# Patient Record
Sex: Male | Born: 1960 | Race: White | Hispanic: No | Marital: Married | Smoking: Current some day smoker
Health system: Southern US, Community
[De-identification: ages and names within clinical notes are randomized; demographics above are authoritative.]

## PROBLEM LIST (undated history)

## (undated) DIAGNOSIS — F17200 Nicotine dependence, unspecified, uncomplicated: Secondary | ICD-10-CM

## (undated) DIAGNOSIS — J302 Other seasonal allergic rhinitis: Secondary | ICD-10-CM

## (undated) DIAGNOSIS — F1210 Cannabis abuse, uncomplicated: Secondary | ICD-10-CM

## (undated) DIAGNOSIS — R55 Syncope and collapse: Secondary | ICD-10-CM

## (undated) DIAGNOSIS — M1990 Unspecified osteoarthritis, unspecified site: Secondary | ICD-10-CM

## (undated) DIAGNOSIS — I119 Hypertensive heart disease without heart failure: Secondary | ICD-10-CM

## (undated) DIAGNOSIS — I10 Essential (primary) hypertension: Secondary | ICD-10-CM

## (undated) DIAGNOSIS — E669 Obesity, unspecified: Secondary | ICD-10-CM

## (undated) DIAGNOSIS — E119 Type 2 diabetes mellitus without complications: Secondary | ICD-10-CM

## (undated) DIAGNOSIS — T7840XA Allergy, unspecified, initial encounter: Secondary | ICD-10-CM

## (undated) HISTORY — PX: FRACTURE SURGERY: SHX138

## (undated) HISTORY — DX: Syncope and collapse: R55

## (undated) HISTORY — DX: Obesity, unspecified: E66.9

## (undated) HISTORY — DX: Allergy, unspecified, initial encounter: T78.40XA

## (undated) HISTORY — DX: Nicotine dependence, unspecified, uncomplicated: F17.200

## (undated) HISTORY — DX: Cannabis abuse, uncomplicated: F12.10

## (undated) HISTORY — PX: REPLACEMENT TOTAL KNEE: SUR1224

## (undated) HISTORY — DX: Hypertensive heart disease without heart failure: I11.9

---

## 2000-02-10 ENCOUNTER — Ambulatory Visit (HOSPITAL_COMMUNITY): Admission: RE | Admit: 2000-02-10 | Discharge: 2000-02-10 | Payer: Self-pay | Admitting: Orthopedic Surgery

## 2000-02-10 ENCOUNTER — Encounter: Payer: Self-pay | Admitting: Orthopedic Surgery

## 2007-03-23 ENCOUNTER — Emergency Department (HOSPITAL_COMMUNITY): Admission: EM | Admit: 2007-03-23 | Discharge: 2007-03-23 | Payer: Self-pay | Admitting: Emergency Medicine

## 2007-03-23 ENCOUNTER — Ambulatory Visit: Payer: Self-pay | Admitting: Internal Medicine

## 2007-03-29 ENCOUNTER — Ambulatory Visit: Payer: Self-pay | Admitting: Family Medicine

## 2007-03-30 LAB — CONVERTED CEMR LAB
ALT: 21 units/L (ref 0–40)
AST: 20 units/L (ref 0–37)
Albumin: 3.8 g/dL (ref 3.5–5.2)
Alkaline Phosphatase: 38 units/L — ABNORMAL LOW (ref 39–117)
BUN: 16 mg/dL (ref 6–23)
Basophils Absolute: 0.2 10*3/uL — ABNORMAL HIGH (ref 0.0–0.1)
Basophils Relative: 2.1 % — ABNORMAL HIGH (ref 0.0–1.0)
Bilirubin, Direct: 0.1 mg/dL (ref 0.0–0.3)
CO2: 31 meq/L (ref 19–32)
Calcium: 9.4 mg/dL (ref 8.4–10.5)
Chloride: 106 meq/L (ref 96–112)
Creatinine, Ser: 0.9 mg/dL (ref 0.4–1.5)
Eosinophils Absolute: 0.2 10*3/uL (ref 0.0–0.6)
Eosinophils Relative: 1.6 % (ref 0.0–5.0)
GFR calc Af Amer: 117 mL/min
GFR calc non Af Amer: 97 mL/min
Glucose, Bld: 78 mg/dL (ref 70–99)
HCT: 40.9 % (ref 39.0–52.0)
Hemoglobin: 14.2 g/dL (ref 13.0–17.0)
Lymphocytes Relative: 31.7 % (ref 12.0–46.0)
MCHC: 34.8 g/dL (ref 30.0–36.0)
MCV: 92.8 fL (ref 78.0–100.0)
Monocytes Absolute: 0.4 10*3/uL (ref 0.2–0.7)
Monocytes Relative: 4.5 % (ref 3.0–11.0)
Neutro Abs: 5.6 10*3/uL (ref 1.4–7.7)
Neutrophils Relative %: 60.1 % (ref 43.0–77.0)
Platelets: 302 10*3/uL (ref 150–400)
Potassium: 4.1 meq/L (ref 3.5–5.1)
RBC: 4.4 M/uL (ref 4.22–5.81)
RDW: 12 % (ref 11.5–14.6)
Sodium: 142 meq/L (ref 135–145)
TSH: 0.66 microintl units/mL (ref 0.35–5.50)
Total Bilirubin: 0.7 mg/dL (ref 0.3–1.2)
Total Protein: 6.9 g/dL (ref 6.0–8.3)
WBC: 9.4 10*3/uL (ref 4.5–10.5)

## 2007-04-12 ENCOUNTER — Ambulatory Visit: Payer: Self-pay

## 2007-04-12 ENCOUNTER — Encounter: Payer: Self-pay | Admitting: Internal Medicine

## 2007-05-10 ENCOUNTER — Ambulatory Visit: Payer: Self-pay | Admitting: Family Medicine

## 2007-08-14 DIAGNOSIS — R51 Headache: Secondary | ICD-10-CM

## 2007-08-14 DIAGNOSIS — R519 Headache, unspecified: Secondary | ICD-10-CM | POA: Insufficient documentation

## 2007-09-15 ENCOUNTER — Telehealth: Payer: Self-pay | Admitting: Family Medicine

## 2007-09-28 ENCOUNTER — Ambulatory Visit: Payer: Self-pay | Admitting: Family Medicine

## 2007-09-28 DIAGNOSIS — I119 Hypertensive heart disease without heart failure: Secondary | ICD-10-CM | POA: Insufficient documentation

## 2007-09-28 DIAGNOSIS — F17200 Nicotine dependence, unspecified, uncomplicated: Secondary | ICD-10-CM

## 2007-09-28 DIAGNOSIS — E669 Obesity, unspecified: Secondary | ICD-10-CM

## 2007-09-28 HISTORY — DX: Obesity, unspecified: E66.9

## 2007-09-28 HISTORY — DX: Nicotine dependence, unspecified, uncomplicated: F17.200

## 2007-09-28 HISTORY — DX: Hypertensive heart disease without heart failure: I11.9

## 2007-11-03 ENCOUNTER — Ambulatory Visit: Payer: Self-pay | Admitting: Family Medicine

## 2007-12-16 ENCOUNTER — Emergency Department (HOSPITAL_COMMUNITY): Admission: EM | Admit: 2007-12-16 | Discharge: 2007-12-16 | Payer: Self-pay | Admitting: Emergency Medicine

## 2007-12-20 ENCOUNTER — Telehealth (INDEPENDENT_AMBULATORY_CARE_PROVIDER_SITE_OTHER): Payer: Self-pay | Admitting: *Deleted

## 2007-12-27 ENCOUNTER — Telehealth: Payer: Self-pay | Admitting: Family Medicine

## 2007-12-29 ENCOUNTER — Encounter: Payer: Self-pay | Admitting: Family Medicine

## 2007-12-29 ENCOUNTER — Ambulatory Visit (HOSPITAL_COMMUNITY): Admission: RE | Admit: 2007-12-29 | Discharge: 2007-12-29 | Payer: Self-pay | Admitting: Cardiology

## 2008-12-20 DIAGNOSIS — R55 Syncope and collapse: Secondary | ICD-10-CM

## 2008-12-20 HISTORY — DX: Syncope and collapse: R55

## 2009-12-26 ENCOUNTER — Telehealth: Payer: Self-pay | Admitting: Family Medicine

## 2009-12-29 ENCOUNTER — Emergency Department (HOSPITAL_COMMUNITY): Admission: EM | Admit: 2009-12-29 | Discharge: 2009-12-29 | Payer: Self-pay | Admitting: Emergency Medicine

## 2010-12-20 HISTORY — PX: THUMB ARTHROSCOPY: SHX2509

## 2011-01-10 ENCOUNTER — Encounter: Payer: Self-pay | Admitting: Family Medicine
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
## 2011-02-15 ENCOUNTER — Emergency Department (HOSPITAL_BASED_OUTPATIENT_CLINIC_OR_DEPARTMENT_OTHER)
Admission: EM | Admit: 2011-02-15 | Discharge: 2011-02-15 | Disposition: A | Discharge status: Home / Self Care | Payer: BC Managed Care – PPO | Attending: Emergency Medicine | Admitting: Emergency Medicine

## 2011-02-15 ENCOUNTER — Emergency Department (INDEPENDENT_AMBULATORY_CARE_PROVIDER_SITE_OTHER): Payer: BC Managed Care – PPO

## 2011-02-15 DIAGNOSIS — M545 Low back pain, unspecified: Secondary | ICD-10-CM | POA: Insufficient documentation

## 2011-02-15 DIAGNOSIS — R209 Unspecified disturbances of skin sensation: Secondary | ICD-10-CM

## 2011-02-15 DIAGNOSIS — M25569 Pain in unspecified knee: Secondary | ICD-10-CM

## 2011-02-15 DIAGNOSIS — M549 Dorsalgia, unspecified: Secondary | ICD-10-CM

## 2011-02-15 DIAGNOSIS — Y92410 Unspecified street and highway as the place of occurrence of the external cause: Secondary | ICD-10-CM | POA: Insufficient documentation

## 2011-02-15 DIAGNOSIS — S93609A Unspecified sprain of unspecified foot, initial encounter: Secondary | ICD-10-CM | POA: Insufficient documentation

## 2011-02-15 DIAGNOSIS — M79609 Pain in unspecified limb: Secondary | ICD-10-CM

## 2011-03-02 ENCOUNTER — Telehealth: Payer: Self-pay | Admitting: Family Medicine
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
## 2011-03-03 ENCOUNTER — Encounter: Payer: Self-pay | Admitting: Family Medicine

## 2011-03-03 NOTE — Telephone Encounter (Signed)
I cannot see him this week. Maybe someone else can.

## 2011-03-03 NOTE — Telephone Encounter (Signed)
Pt called back to check on status of getting work in ov with Dr Clent Ridges. Pt informed that Dr Clent Ridges unavailable this week. Pt has been schedule a ov with Dr Caryl Never for Thurs 03/04/11.

## 2011-03-04 ENCOUNTER — Ambulatory Visit (INDEPENDENT_AMBULATORY_CARE_PROVIDER_SITE_OTHER): Payer: No Typology Code available for payment source | Admitting: Family Medicine

## 2011-03-04 ENCOUNTER — Encounter: Payer: Self-pay | Admitting: Family Medicine

## 2011-03-04 VITALS — BP 130/82 | Temp 98.7°F | Ht 68.0 in | Wt 240.0 lb

## 2011-03-04 DIAGNOSIS — M545 Low back pain: Secondary | ICD-10-CM

## 2011-03-04 MED ORDER — MELOXICAM 15 MG PO TABS: 15.0000 mg | ORAL_TABLET | Freq: Every day | ORAL | Status: DC

## 2011-03-04 NOTE — Patient Instructions (Signed)
Low Back Sprain with Rehab    A sprain is an injury in which a ligament is torn. The ligaments of the lower back are vulnerable to sprains. However, they are strong and require great force to be injured. These ligaments are important for stabilizing the spinal column. Sprains are classified into three categories. Grade 1 sprains cause pain, but the tendon is not lengthened. Grade 2 sprains include a lengthened ligament, due to the ligament being stretched or partially ruptured. With grade 2 sprains there is still function, although the function may be decreased. Grade 3 sprains involve a complete tear of the tendon or muscle, and function is usually impaired.   SYMPTOMS  Severe pain in the lower back.  Sometimes, a feeling of a "pop," "snap," or tear, at the time of injury.  Tenderness and sometimes swelling at the injury site.  Uncommonly, bruising (contusion) within 48 hours of injury.  Muscle spasms in the back.   CAUSES  Low back sprains occur when a force is placed on the ligaments that is greater than they can handle. Common causes of injury include:  Performing a stressful act while off-balance.  Repetitive stressful activities that involve movement of the lower back.  Direct hit (trauma) to the lower back.   RISK INCREASES WITH   Contact sports (football, wrestling).  Collisions (major skiing accidents).  Sports that require throwing or lifting (baseball, weightlifting).  Sports involving twisting of the spine (gymnastics, diving, tennis, golf).  Poor strength and flexibility.  Inadequate protection.  Previous back injury or surgery (especially fusion).   PREVENTIVE MEASURES   Wear properly fitted and padded protective equipment.  Warm up and stretch properly before activity.  Allow for adequate recovery between workouts.  Maintain physical fitness: l Strength, flexibility, and endurance. l Cardiovascular fitness.  Maintain a healthy body weight.     PROGNOSIS If treated properly, low back sprains usually heal with non-surgical treatment. The length of time for healing depends on the severity of the injury.    POSSIBLE COMPLICATIONS   Recurring symptoms, resulting in a chronic problem.  Chronic inflammation and pain in the low back.  Delayed healing or resolution of symptoms, especially if activity is resumed too soon.  Prolonged impairment.  Unstable or arthritic joints of the low back.   GENERAL TREATMENT CONSIDERATIONS  Treatment first involves the use of ice and medicine, to reduce pain and inflammation. The use of strengthening and stretching exercises may help reduce pain with activity. These exercises may be performed at home or with a therapist. Severe injuries may require referral to a therapist for further evaluation and treatment, such as ultrasound. Your caregiver may advise that you wear a back brace or corset, to help reduce pain and discomfort. Often, prolonged bed rest results in greater harm then benefit. Corticosteroid injections may be recommended. However, these should be reserved for the most serious cases. It is important to avoid using your back when lifting objects. At night, sleep on your back on a firm mattress, with a pillow placed under your knees. If non-surgical treatment is unsuccessful, surgery may be needed.    MEDICATION:   If pain medicine is needed, nonsteroidal anti-inflammatory medicines (aspirin and ibuprofen), or other minor pain relievers (acetaminophen), are often advised.   Do not take pain medicine for 7 days before surgery.   Prescription pain relievers may be given, if your caregiver thinks they are needed. Use only as directed and only as much as you need.  Ointments applied to   the skin may be helpful.  Corticosteroid injections may be given by your caregiver. These injections should be reserved for the most serious cases, because they may only be given a certain number of times.    HEAT AND COLD:   Cold treatment (icing) should be applied for 10 to 15 minutes every 2 to 3 hours for inflammation and pain, and immediately after activity that aggravates your symptoms. Use ice packs or an ice massage.  Heat treatment may be used before performing stretching and strengthening activities prescribed by your caregiver, physical therapist, or athletic trainer. Use a heat pack or a warm water soak.     SEEK MEDICAL CARE IF:   Symptoms get worse or do not improve in 2 to 4 weeks, despite treatment.  You develop numbness or weakness in either leg.  You lose bowel or bladder function.  Any of the following occur after surgery: fever, increased pain, swelling, redness, drainage of fluids, or bleeding in the affected area.  New, unexplained symptoms develop. (Drugs used in treatment may produce side effects.)     EXERCISES   RANGE OF MOTION AND STRETCHING EXERCISES - Low Back Sprain Most people with lower back pain will find that their symptoms get worse with excessive bending forward (flexion) or arching at the lower back (extension). The exercises that will help resolve your symptoms will focus on the opposite motion.    Your physician, physical therapist or athletic trainer will help you determine which exercises will be most helpful to resolve your lower back pain. Do not complete any exercises without first consulting with your caregiver. Discontinue any exercises which make your symptoms worse, until you speak to your caregiver.    If you have pain, numbness or tingling which travels down into your buttocks, leg or foot, the goal of the therapy is for these symptoms to move closer to your back and eventually resolve. Sometimes, these leg symptoms will get better, but your lower back pain may worsen. This is often an indication of progress in your rehabilitation. Be very alert to any changes in your symptoms and the activities in which you participated in the 24 hours prior  to the change. Sharing this information with your caregiver will allow him or her to most efficiently treat your condition.   These exercises may help you when beginning to rehabilitate your injury. Your symptoms may resolve with or without further involvement from your physician, physical therapist or athletic trainer. While completing these exercises, remember:   Restoring tissue flexibility helps normal motion to return to the joints. This allows healthier, less painful movement and activity.  An effective stretch should be held for at least 30 seconds.  A stretch should never be painful. You should only feel a gentle lengthening or release in the stretched tissue.      FLEXION RANGE OF MOTION AND STRETCHING EXERCISES:  STRETCH - Flexion, Single Knee to Chest   Lie on a firm bed or floor with both legs extended in front of you.  Keeping one leg in contact with the floor, bring your opposite knee to your chest. Hold your leg in place by either grabbing behind your thigh or at your knee.  Pull until you feel a gentle stretch in your low back. Hold __________ seconds.  Slowly release your grasp and repeat the exercise with the opposite side. Repeat __________ times. Complete this exercise __________ times per day.     STRETCH - Flexion, Double Knee to Chest     Lie on a firm bed or floor with both legs extended in front of you.  Keeping one leg in contact with the floor, bring your opposite knee to your chest.    Tense your stomach muscles to support your back and then lift your other knee to your chest. Hold your legs in place by either grabbing behind your thighs or at your knees.  Pull both knees toward your chest until you feel a gentle stretch in your low back. Hold __________ seconds.  Tense your stomach muscles and slowly return one leg at a time to the floor. Repeat __________ times. Complete this exercise __________ times per day.     STRETCH - Low Trunk Rotation   Lie  on a firm bed or floor. Keeping your legs in front of you, bend your knees so they are both pointed toward the ceiling and your feet are flat on the floor.  Extend your arms out to the side. This will stabilize your upper body by keeping your shoulders in contact with the floor.  Gently and slowly drop both knees together to one side until you feel a gentle stretch in your low back. Hold for __________ seconds.   Tense your stomach muscles to support your lower back as you bring your knees back to the starting position. Repeat the exercise to the other side. Repeat __________ times. Complete this exercise __________ times per day      EXTENSION RANGE OF MOTION AND FLEXIBILITY EXERCISES:  STRETCH - Extension, Prone on Elbows   Lie on your stomach on the floor, a bed will be too soft. Place your palms about shoulder width apart and at the height of your head.  Place your elbows under your shoulders. If this is too painful, stack pillows under your chest.  Allow your body to relax so that your hips drop lower and make contact more completely with the floor.  Hold this position for __________ seconds.  Slowly return to lying flat on the floor. Repeat __________ times. Complete this exercise __________ times per day.     RANGE OF MOTION - Extension, Prone Press Ups   Lie on your stomach on the floor, a bed will be too soft. Place your palms about shoulder width apart and at the height of your head.  Keeping your back as relaxed as possible, slowly straighten your elbows while keeping your hips on the floor. You may adjust the placement of your hands to maximize your comfort. As you gain motion, your hands will come more underneath your shoulders.  Hold this position __________ seconds.  Slowly return to lying flat on the floor. Repeat __________ times. Complete this exercise __________ times per day.     RANGE OF MOTION- Quadruped, Neutral Spine   Assume a hands and knees position  on a firm surface. Keep your hands under your shoulders and your knees under your hips. You may place padding under your knees for comfort.    Drop your head and point your tailbone toward the ground below you. This will round out your lower back like an angry cat. Hold this position for __________ seconds.   Slowly lift your head and release your tail bone so that your back sags into a large arch, like an old horse.  Hold this position for __________ seconds.   Repeat this until you feel limber in your low back.  Now, find your "sweet spot." This will be the most comfortable position somewhere between the two previous   positions. This is your neutral spine. Once you have found this position, tense your stomach muscles to support your low back.  Hold this position for __________ seconds. Repeat __________ times. Complete this exercise __________ times per day.      STRENGTHENING EXERCISES - Low Back Sprain These exercises may help you when beginning to rehabilitate your injury. These exercises should be done near your "sweet spot." This is the neutral, low-back arch, somewhere between fully rounded and fully arched, that is your least painful position. When performed in this safe range of motion, these exercises can be used for people who have either a flexion or extension based injury. These exercises may resolve your symptoms with or without further involvement from your physician, physical therapist or athletic trainer. While completing these exercises, remember:   Muscles can gain both the endurance and the strength needed for everyday activities through controlled exercises.  Complete these exercises as instructed by your physician, physical therapist or athletic trainer. Increase the resistance and repetitions only as guided.  You may experience muscle soreness or fatigue, but the pain or discomfort you are trying to eliminate should never worsen during these exercises. If this pain does  worsen, stop and make certain you are following the directions exactly. If the pain is still present after adjustments, discontinue the exercise until you can discuss the trouble with your caregiver.     STRENGTHENING - Deep Abdominals, Pelvic Tilt   Lie on a firm bed or floor. Keeping your legs in front of you, bend your knees so they are both pointed toward the ceiling and your feet are flat on the floor.  Tense your lower abdominal muscles to press your low back into the floor.  This motion will rotate your pelvis so that your tail bone is scooping upwards rather than pointing at your feet or into the floor. With a gentle tension and even breathing, hold this position for __________ seconds. Repeat __________ times. Complete this exercise __________ times per day.      STRENGTHENING - Abdominals, Crunches   Lie on a firm bed or floor. Keeping your legs in front of you, bend your knees so they are both pointed toward the ceiling and your feet are flat on the floor. Cross your arms over your chest.    Slightly tip your chin down without bending your neck.  Tense your abdominals and slowly lift your trunk high enough to just clear your shoulder blades. Lifting higher can put excessive stress on the lower back and does not further strengthen your abdominal muscles.  Control your return to the starting position. Repeat __________ times. Complete this exercise __________ times per day.     STRENGTHENING - Quadruped, Opposite UE/LE Lift   Assume a hands and knees position on a firm surface. Keep your hands under your shoulders and your knees under your hips. You may place padding under your knees for comfort.    Find your neutral spine and gently tense your abdominal muscles so that you can maintain this position. Your shoulders and hips should form a rectangle that is parallel with the floor and is not twisted.   Keeping your trunk steady, lift your right hand no higher than your shoulder  and then your left leg no higher than your hip. Make sure you are not holding your breath. Hold this position for __________ seconds.  Continuing to keep your abdominal muscles tense and your back steady, slowly return to your starting position. Repeat with the   opposite arm and leg. Repeat __________ times. Complete this exercise __________ times per day.      STRENGTHENING - Abdominals and Quadriceps, Straight Leg Raise   Lie on a firm bed or floor with both legs extended in front of you.  Keeping one leg in contact with the floor, bend the other knee so that your foot can rest flat on the floor.  Find your neutral spine, and tense your abdominal muscles to maintain your spinal position throughout the exercise.  Slowly lift your straight leg off the floor about 6 inches for a count of 15, making sure to not hold your breath.  Still keeping your neutral spine, slowly lower your leg all the way to the floor.   Repeat this exercise with each leg __________ times. Complete this exercise __________ times per day.     POSTURE AND BODY MECHANICS CONSIDERATIONS - Low Back Sprain Keeping correct posture when sitting, standing or completing your activities will reduce the stress put on different body tissues, allowing injured tissues a chance to heal and limiting painful experiences. The following are general guidelines for improved posture. Your physician or physical therapist will provide you with any instructions specific to your needs. While reading these guidelines, remember:  The exercises prescribed by your provider will help you have the flexibility and strength to maintain correct postures.  The correct posture provides the best environment for your joints to work. All of your joints have less wear and tear when properly supported by a spine with good posture. This means you will experience a healthier, less painful body.  Correct posture must be practiced with all of your activities,  especially prolonged sitting and standing. Correct posture is as important when doing repetitive low-stress activities (typing) as it is when doing a single heavy-load activity (lifting).     RESTING POSITIONS Consider which positions are most painful for you when choosing a resting position. If you have pain with flexion-based activities (sitting, bending, stooping, squatting), choose a position that allows you to rest in a less flexed posture. You would want to avoid curling into a fetal position on your side. If your pain worsens with extension-based activities (prolonged standing, working overhead), avoid resting in an extended position such as sleeping on your stomach. Most people will find more comfort when they rest with their spine in a more neutral position, neither too rounded nor too arched. Lying on a non-sagging bed on your side with a pillow between your knees, or on your back with a pillow under your knees will often provide some relief.  Keep in mind, being in any one position for a prolonged period of time, no matter how correct your posture, can still lead to stiffness.    PROPER SITTING POSTURE In order to minimize stress and discomfort on your spine, you must sit with correct posture. Sitting with good posture should be effortless for a healthy body. Returning to good posture is a gradual process. Many people can work toward this most comfortably by using various supports until they have the flexibility and strength to maintain this posture on their own.   When sitting with proper posture, your ears will fall over your shoulders and your shoulders will fall over your hips. You should use the back of the chair to support your upper back. Your lower back will be in a neutral position, just slightly arched. You may place a small pillow or folded towel at the base of your lower back for    support.    When working at a desk, create an environment that supports good, upright posture.  Without extra support, muscles tire, which leads to excessive strain on joints and other tissues. Keep these recommendations in mind:   CHAIR:    A chair should be able to slide under your desk when your back makes contact with the back of the chair. This allows you to work closely.  The chair's height should allow your eyes to be level with the upper part of your monitor and your hands to be slightly lower than your elbows.      BODY POSITION  Your feet should make contact with the floor. If this is not possible, use a foot rest.  Keep your ears over your shoulders. This will reduce stress on your neck and low back.     INCORRECT SITTING POSTURES  If you are feeling tired and unable to assume a healthy sitting posture, do not slouch or slump. This puts excessive strain on your back tissues, causing more damage and pain. Healthier options include:  Using more support, like a lumbar pillow.  Switching tasks to something that requires you to be upright or walking.  Talking a brief walk.  Lying down to rest in a neutral-spine position.      PROLONGED STANDING WHILE SLIGHTLY LEANING FORWARD  When completing a task that requires you to lean forward while standing in one place for a long time, place either foot up on a stationary 2-4 inch high object to help maintain the best posture. When both feet are on the ground, the lower back tends to lose its slight inward curve. If this curve flattens (or becomes too large), then the back and your other joints will experience too much stress, tire more quickly, and can cause pain.       CORRECT STANDING POSTURES Proper standing posture should be assumed with all daily activities, even if they only take a few moments, like when brushing your teeth. As in sitting, your ears should fall over your shoulders and your shoulders should fall over your hips. You should keep a slight tension in your abdominal muscles to brace your spine. Your tailbone  should point down to the ground, not behind your body, resulting in an over-extended swayback posture.      INCORRECT STANDING POSTURES  Common incorrect standing postures include a forward head, locked knees and/or an excessive swayback.     WALKING Walk with an upright posture. Your ears, shoulders and hips should all line-up.     PROLONGED ACTIVITY IN A FLEXED POSITION When completing a task that requires you to bend forward at your waist or lean over a low surface, try to find a way to stabilize 3 out of 4 of your limbs. You can place a hand or elbow on your thigh or rest a knee on the surface you are reaching across. This will provide you more stability, so that your muscles do not tire as quickly. By keeping your knees relaxed, or slightly bent, you will also reduce stress across your lower back.     CORRECT LIFTING TECHNIQUES DO :   Assume a wide stance. This will provide you more stability and the opportunity to get as close as possible to the object which you are lifting.  Tense your abdominals to brace your spine. Bend at the knees and hips. Keeping your back locked in a neutral-spine position, lift using your leg muscles. Lift with your legs, keeping your   back straight.  Test the weight of unknown objects before attempting to lift them.  Try to keep your elbows locked down at your sides in order get the best strength from your shoulders when carrying an object.  Always ask for help when lifting heavy or awkward objects.    INCORRECT LIFTING TECHNIQUES DO NOT:   Lock your knees when lifting, even if it is a small object.  Bend and twist. Pivot at your feet or move your feet when needing to change directions.  Assume that you can safely pick up even a paperclip without proper posture.   Document Released: 12/06/2005  Document Re-Released: 10/03/2009 [employer] Patient Information 2011 [employer] 
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
## 2011-05-01 ENCOUNTER — Emergency Department (INDEPENDENT_AMBULATORY_CARE_PROVIDER_SITE_OTHER): Payer: No Typology Code available for payment source

## 2011-05-01 ENCOUNTER — Emergency Department (HOSPITAL_BASED_OUTPATIENT_CLINIC_OR_DEPARTMENT_OTHER)
Admission: EM | Admit: 2011-05-01 | Discharge: 2011-05-01 | Disposition: A | Discharge status: Home / Self Care | Payer: No Typology Code available for payment source | Attending: Emergency Medicine | Admitting: Emergency Medicine

## 2011-05-01 DIAGNOSIS — M545 Low back pain: Secondary | ICD-10-CM

## 2011-05-01 DIAGNOSIS — M79609 Pain in unspecified limb: Secondary | ICD-10-CM | POA: Insufficient documentation

## 2011-05-01 DIAGNOSIS — M25569 Pain in unspecified knee: Secondary | ICD-10-CM | POA: Insufficient documentation

## 2011-05-01 DIAGNOSIS — M549 Dorsalgia, unspecified: Secondary | ICD-10-CM | POA: Insufficient documentation

## 2011-05-01 DIAGNOSIS — Y92410 Unspecified street and highway as the place of occurrence of the external cause: Secondary | ICD-10-CM | POA: Insufficient documentation

## 2011-05-01 DIAGNOSIS — M51379 Other intervertebral disc degeneration, lumbosacral region without mention of lumbar back pain or lower extremity pain: Secondary | ICD-10-CM | POA: Insufficient documentation

## 2011-05-01 DIAGNOSIS — M5137 Other intervertebral disc degeneration, lumbosacral region: Secondary | ICD-10-CM | POA: Insufficient documentation

## 2011-05-01 DIAGNOSIS — M542 Cervicalgia: Secondary | ICD-10-CM

## 2011-05-03 ENCOUNTER — Ambulatory Visit (INDEPENDENT_AMBULATORY_CARE_PROVIDER_SITE_OTHER): Payer: BC Managed Care – PPO | Admitting: Family Medicine

## 2011-05-03 ENCOUNTER — Encounter: Payer: Self-pay | Admitting: Family Medicine

## 2011-05-03 VITALS — BP 170/84 | HR 68 | Temp 98.9°F | Ht 68.0 in | Wt 254.0 lb

## 2011-05-03 DIAGNOSIS — M545 Low back pain, unspecified: Secondary | ICD-10-CM

## 2011-05-03 MED ORDER — CYCLOBENZAPRINE HCL 10 MG PO TABS: 10.0000 mg | ORAL_TABLET | Freq: Three times a day (TID) | ORAL | Status: AC | PRN

## 2011-05-03 MED ORDER — PREDNISONE (PAK) 10 MG PO TABS: ORAL_TABLET | ORAL | Status: DC

## 2011-05-03 MED ORDER — HYDROCODONE-ACETAMINOPHEN 10-325 MG PO TABS: 1.0000 | ORAL_TABLET | Freq: Four times a day (QID) | ORAL | Status: AC | PRN
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
## 2011-05-04 NOTE — Cardiovascular Report (Signed)
NAMELOEL, BETANCUR NO.:  [id number]   MEDICAL RECORD NO.:  [id number]          PATIENT TYPE:  OIB   LOCATION:  2854                         FACILITY:  [facility]   PHYSICIAN:  Peter M. Swaziland, M.D.  DATE OF BIRTH:  1961/01/16   DATE OF PROCEDURE:  12/29/2007  DATE OF DISCHARGE:                            CARDIAC CATHETERIZATION   INDICATION FOR PROCEDURE:  The patient is a 50 year old white male with  history of hypertension, hyperlipidemia, strong family history of  coronary disease, who presents with refractory chest pain.   PROCEDURE:  Left heart catheterization, coronary and left ventricular  angiography.  Equipment used was 6-French 4 cm right and left Judkins  catheter, 6-French pigtail catheter, 6-French arterial sheath.   MEDICATIONS:  Local anesthesia with 1% Xylocaine, Versed 2 mg IV,  fentanyl 25 mcg IV.  Contrast:  100 mL of Omnipaque.   HEMODYNAMIC DATA:  Aortic pressure was 141/81 with a mean of 106 mmHg.  Left ventricle pressure was 141 with an EDP of 22 mmHg.   ANGIOGRAPHIC DATA:  Left coronary artery arises and distributes in a  dominant fashion.  The left main coronary artery has a 10% to 20%  narrowing distally.   The left anterior descending artery has minor irregularities, less than  10%.   The left circumflex coronary is a large dominant vessel and appears  normal throughout.   The right coronary is very small nondominant vessel and appears normal.   Left ventricular angiography was performed in the RAO view.  This  demonstrates normal left ventricular size and contractility with normal  systolic function.  Ejection fraction is estimated at 60%.  There is no  mitral regurgitation or prolapse.   The right groin angiography showed the catheter in the body of the  femoral artery.  The right groin was closed with an AngioSeal device  with excellent hemostasis.   FINAL INTERPRETATION:  1. 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  2. Normal left ventricular function.   PLAN:  Would recommend risk factor modification and evaluation for  noncardiac causes of chest pain.           ______________________________  Peter M. [place]aziland, M.D.     PMJ/MEDQ  D:  12/29/2007  T:  12/29/2007  Job:  [id number]   cc:   Tinnie Gens A. Tawanna Cooler, MD  Tera Mater Clent Ridges, MD

## 2011-05-04 NOTE — Assessment & Plan Note (Signed)
[facility]                                 ON-CALL NOTE   NAME:Willie Elliott, Willie Elliott                           MRN:          [id number]  DATE:12/16/2007                            DOB:          10-Jan-1961    PRIMARY CARE PHYSICIAN:  Jeannett Senior A. Clent Ridges, M.D.   Willie Elliott calls in today stating that he has been having chest pain with  radiation into his left arm since last night.  The patient refused the  emergency department secondary to past experiences.  The phone was  passed to me after my nurse initially took the call.  I was able to  speak to the wife and the wife states that he will not go by ambulance.  I advised her of the gravity of the situation regarding chest pain  radiating to his left arm.  I recommended that she give him an aspirin  if he has no allergies and call 911 for him to be transferred.  Again  she refused as well as her husband to be transferred by ambulance.  I  advised that they should take him by private transportation otherwise.  She states that he would be possibly willing to do that.  I advised her  of the possibility of an acute coronary event which could potentially be  deadly.  She concurred and states that she will take him to [facility] ED.     Leanne Chang, M.D.  Electronically Signed    LA/MedQ  DD: 12/16/2007  DT: 12/16/2007  Job #: [id number]

## 2011-05-04 NOTE — H&P (Signed)
NAMEARDIE, MCLENNAN NO.:  [id number]   MEDICAL RECORD NO.:  [id number]           PATIENT TYPE:   LOCATION:                                 FACILITY:   PHYSICIAN:  Peter M. Swaziland, M.D.  DATE OF BIRTH:  1961/05/16   DATE OF ADMISSION:  12/29/2007  DATE OF DISCHARGE:                              HISTORY & PHYSICAL   HISTORY OF PRESENT ILLNESS:  Willie Elliott is a 50 year old white male who is  seen for recent onset and refractory chest pain.  The patient has a  history of hypertension.  He has a very strong family history of early  vascular disease, and he has a history of tobacco abuse.  He states that  the day after Christmas he experienced an episode of significant chest  pain.  This was substernal in nature and radiated to his left arm.  It  was associated with some shortness of breath.  He states his pain  persisted until the following day, and then he presented to the  emergency room for evaluation.  His evaluation there indicated a normal  ECG and chest x-ray, had one set of cardiac enzymes that was normal.  They recommended admission for further evaluation, but the patient  refused.  He states since then he has had 4 other episodes of  significant chest pain, the most recent being last night.  He states his  episode last night was more severe.  The patient reports that he had a  stress test done approximately 3 months ago and was told it was okay.  This was just a routine stress test.  He has had no other cardiac  evaluation and generally avoids doctors.  He does report he quit smoking  8 days ago.  He has been started on Norvasc for hypertension.  His  cholesterol status is unknown.   His past medical history is significant for hypertension.  He was  hospitalized at age 38 for an overdose.  He has no known allergies, but  he reports aspirin has caused a nosebleed in the past.   CURRENT MEDICATIONS:  Amlodipine 5 mg per day, aspirin 81 mg per day.   SOCIAL  HISTORY:  The patient works in [employer].  He is  married.  He has 2  children.  He has been a heavy smoker for 30 years  but states he quit 8 days ago.  Denies alcohol use.   FAMILY HISTORY:  Father is age 80 and has heart disease.  Mother died at  age 18 of COPD.  He has 4 brothers.  One brother has had a massive  myocardial infarction and is awaiting cardiac transplant. One sister has  had redo coronary bypass surgery at age 87.  Multiple family members  have had hypertension.   REVIEW OF SYSTEMS:  He denies claudication.  He has had no history of  TIA or stroke.  He has had no edema, orthopnea or PND.  Denies any  recent bleeding problems.  No recent change in bowel or bladder habits.  Other review of  systems are negative.   PHYSICAL EXAMINATION:  GENERAL:  The patient is an overweight white male  in no apparent distress.  VITAL SIGNS:  His weight is 245-1/2.  Blood pressure is 120/82, pulse 72  and regular, respirations are normal.  HEENT:  He is normocephalic, atraumatic.  His pupils equal, round,  reactive to light and accommodation.  Extraocular movements are intact.  Sclerae are clear.  Oropharynx is clear.  NECK:  Supple without JVD, adenopathy, thyromegaly or bruits.  LUNGS:  Clear to auscultation and percussion.  CARDIAC:  Exam reveals a regular rate and rhythm without gallop, murmur,  rub or click.  ABDOMEN:  Obese, soft and nontender without mass or bruit.  EXTREMITIES:  Without edema.  Pedal pulses are palpable.  He has no phlebitis.  NEUROLOGIC:  Exam is nonfocal   LABORATORY DATA:  Chest x-ray from the emergency department showed no  active disease.  His ECG was normal.  Glucose was 116, BUN 23,  creatinine 0.8.  Electrolytes, liver function studies normal.  Total  cholesterol is 234, LDL 145, HDL 73 and triglycerides of 83.  CBC and  coags were normal.   IMPRESSION:  1. Chest pain consistent with new onset of angina.  The patient has      multiple  cardiac risk factors.  Prior noninvasive evaluation was      apparently unremarkable 3 months ago.  2. Hypertension.  3. Hypercholesterolemia.  4. Very strong family history of early coronary disease.   PLAN:  I have recommended proceeding directly to cardiac catheterization  at this point to identify his cardiac anatomy and assess further  management options.  If he does have vascular disease will need to  start on statin therapy.           ______________________________  Peter M. [place]azil[place], M.D.     PMJ/MEDQ  D:  12/28/2007  T:  12/28/2007  Job:  [id number]   cc:   Tinnie Gens A. Tawanna Cooler, MD

## 2011-05-04 NOTE — Assessment & Plan Note (Signed)
[facility]                                 ON-CALL NOTE   NAME:Marquess, BRYCIN                           MRN:          [id number]  DATE:12/16/2007                            DOB:          Feb 18, 1961    PRIMARY CARE PHYSICIAN:  Dr. Clent Ridges.   Mr. J[place]lin calls back to the answering service after being advised to go  to emergency department. According to the answering service message, he  called cussing, and wanting [place] to call the emergency department so  that he would not have to wait in the waiting room. I did advise his  wife previously that it is unlikely that he would have to wait if he has  chest pain radiating into his arm and if they would go by ambulance.  Nonetheless, we did go ahead and call the emergency department and  advised the triage nurse that the patient had previously refused to go  by ambulance and will be arriving by private transportation.     Leanne Chang, M.D.  Electronically Signed    LA/MedQ  DD: 12/16/2007  DT: 12/17/2007  Job #: [id number]

## 2011-05-07 NOTE — Assessment & Plan Note (Signed)
[facility]                            [facility] OFFICE NOTE   NAME:Willie Elliott, Willie Elliott                         MRN:          [id number]  DATE:03/29/2007                            DOB:          1961/02/09    This is a 50 year old gentleman who is introducing himself to me today  for followup of a syncopal episode.  His initial visit with our clinic  was with Dr. Amador Cunas on April 3.  I will refer you to the record  concerning details of this note.  We reiterated his history today.  Basically he has been healthy he feels most of his life, until 11:30  p.m. on  April 2, when he got out of bed and walked into his kitchen to  get something to drink.  While drinking milk that he had just gotten out  of the refrigerator, he completely passed out and fell to the floor.  He  said he felt no symptoms prior to this episode at all and he  specifically denied any headache, dizziness, palpitations, chest pain,  shortness of breath, nausea, etc.  The noise of him hitting the floor  woke his wife up and she ran out to the kitchen to see him, apparently  he only loss consciousness for a few minutes and came to fairly quickly.  When he did come to however, he was talking crazy and making  references about dead relatives etc.  After a few moments, this cleared  up and his speech became quite lucid.  There was no loss of bowel or  bladder control.  No witnessed shaking or clenching etc.  I believe he  bumped his elbows and his head but there was no significant injuries  during the fall itself.  When he saw Dr. Amador Cunas, the following  morning on April 3, he had essentially an unremarkable examination.  Dr.  Amador Cunas strongly recommended admission to the hospital to work up  this event, but the patient refused.  He is now establishing with me.  He says he has felt fine every since this episode, except for an  occasional dull headache on the top of his head.  He  specifically denies  any shortness of breath, chest pain, nausea, blurred vision or any other  problems.  His job is quite physically demanding and he has been able to  work every day since with no problem.   PAST MEDICAL HISTORY:  Interestingly, he does tell me about episodes of  chest pains that began about 1 year ago.  Says he has never told anyone  about it until now.  He describes a pressure like pain which comes into  the center to his chest and often radiates to the right side of the  chest, it may radiate down the right arm as well.  These may last for a  few moments up to a half hour at a time and then they stop, they do not  appear to be associated with exertion in anyway.  They are not  associated with the sweats, nausea, headaches, blurred vision,  palpitations or any other symptoms at the time.  He states that he drank  alcohol heavily for years but completely stopped drinking on November 19, 2001 and he has had no alcohol ever since.  Unfortunately, he smokes  quite heavily and averages between 3 and 4 packs per day of cigarettes  and has done so for years.   ALLERGIES:  None.   MEDICATIONS:  None.   FAMILY HISTORY:  Is quite strong for coronary artery disease, including  his brother who was diagnosed with heart disease in his 66s, also his  father has had heart disease, also his sister who is diagnosed with  diabetes and heart disease in her 18s.   OBJECTIVE:  Weight 234, BP 130/82, pulse 72 and regular.  GENERAL:  He is in no acute distress.  He is overweight.  NECK:  Supple without lymphadenopathy or masses.  EYES:  Are clear.  Pupils are equal, round, reactive to light and  accommodation with normal diameters.  NEUROLOGIC:  Exam is grossly intact.  LUNGS:  Are clear.  CARDIAC:  Rate and rhythm regular without gallops, murmurs, rubs.  Distal pulses are full.  EXTREMITIES:  Show no edema.   EKG today is within normal limits.  I can hear no carotid bruits int  he  neck.   ASSESSMENT AND PLAN:  Syncopal episode which is quite worrisome in a  gentleman with tremendous risk factors for heart disease including a  history of intermittent chest pains, including a massive smoking history  and including a very strong family history of heart disease.  Again I  recommended admission to the hospital today and again the patient  refused.  He did begrudgingly agree to an outpatient workup as quickly  as possible.  I recommended him to begin taking 325 mg of aspirin on a  daily basis.  I recommended that he stop smoking immediately or at least  cut back as much as he could.  We will get lab work today.  We will also  set him up for carotid Doppler's, a contrasted head CT scan, an  echocardiogram and a treadmill Myoview stress test as soon as possible.  Certainly if he has any recurrent episodes or feels worse, he should  follow up with [place] during clinic hours or go immediately to the emergency  room.     Tera Mater. Clent Ridges, MD  Electronically Signed    SAF/MedQ  DD: 03/30/2007  DT: 03/30/2007  Job #: [id number]

## 2011-05-07 NOTE — Assessment & Plan Note (Signed)
[facility]                            [facility] OFFICE NOTE   NAME:Willie Elliott, Willie Elliott                         MRN:          [id number]  DATE:03/23/2007                            DOB:          Aug 16, 1961    The patient is a 50 year old gentleman who is seen today for evaluation  of syncope. Last night, shortly before midnight, he awoke and went to  the kitchen and had a sudden syncopal episode without warning after he  tilted his head back ingesting milk out of the carton. His wife who was  asleep heard a loud thump and when she investigated, the patient was  still unresponsive. There was no motor activity, history of urinary  incontinence. After a couple of minutes, the patient became responsive,  but was somewhat disoriented. His wife states that he was crying and  asking about his mother who is deceased. The patient is ordinarily a  very stoic individual and in their decades of marriage she has never  seen this individual cry. There is no apparent family history of  seizures and the patient denies any prior history of fainting or any pre-  syncopal symptoms. At the present time, he feels well except for a  discomfort involving primarily the elbow areas and also is a bit sore in  a generalized fashion. His wife states that throughout the day today his  behavior has been normal. He did go to the emergency room last night,  but after a lengthy wait, he left without being evaluated.   SOCIAL HISTORY:  Both he and his wife identify Dr. Clent Ridges as their  physician. On closer questioning, this patient nor his wife have been  seen by Dr. Clent Ridges, although he does care for their sons. The patient is a  one pack per day smoker and has not been evaluated by a physician in  years.   FAMILY HISTORY:  Is remarkable for coronary artery disease. A brother  apparently is waiting a cardiac transplant following a massive  myocardial infarction and secondary ischemic  cardiomyopathy. A sister is  also status post CABG.   REVIEW OF SYSTEMS:  Examination is otherwise unremarkable. He denies any  history of headaches, family history of seizures, known cardiac  problems. He was noted to be hypertensive in the emergency room setting  last night.   PHYSICAL EXAMINATION:  Revealed a well-developed, stocky male who seemed  a bit belligerent, but in no acute distress. He was alert and  appropriate. Blood pressure was 180/104.  Fundi unremarkable. Pupillary responses were normal. Oropharynx was  clear.  NECK: Revealed no bruits.  CHEST: Was clear.  CARDIOVASCULAR: Revealed a regular rhythm. No murmurs.  ABDOMEN: Benign.  NEURO: Examination revealed the patient's mental status to be normal.  Cranial nerve examination revealed normal pupillary responses and intact  extraocular movements. He has no gross visual field deficits. There is  no drift to the outstretched arms. Romberg was negative. He was able to  walk on his toes and heels without difficulty. He is able to do a tandem  walk reasonably well.  IMPRESSION:  1. Syncope, unclear etiology. Single episode precipitated by      exaggerated neck extension. Possible cardiac sinus      hypersensitivity.  2. Hypertension.  3. Multiple cardiac risk factors.   DISPOSITION:  Options were discussed including immediate hospital  admission. The patient wishes to defer this if possible and since he has  been asymptomatic for almost 24 hours, wishes to proceed with outpatient  evaluation. In view of his moderately high hypertension, the patient was  given samples of Benicar 20/12.5. He will return next week to establish  with his primary care physician Dr. Clent Ridges. Baseline laboratory studies  including EKG will be obtained. He will consider for further evaluation  for his syncope.     Gordy Savers, MD  Electronically Signed    PFK/MedQ  DD: 03/23/2007  DT: 03/23/2007  Job #: [id number]

## 2011-05-11 ENCOUNTER — Ambulatory Visit: Payer: No Typology Code available for payment source | Attending: Family Medicine

## 2011-05-11 DIAGNOSIS — IMO0001 Reserved for inherently not codable concepts without codable children: Secondary | ICD-10-CM | POA: Insufficient documentation

## 2011-05-11 DIAGNOSIS — R5381 Other malaise: Secondary | ICD-10-CM | POA: Insufficient documentation

## 2011-05-11 DIAGNOSIS — M545 Low back pain, unspecified: Secondary | ICD-10-CM | POA: Insufficient documentation

## 2011-05-11 DIAGNOSIS — M25569 Pain in unspecified knee: Secondary | ICD-10-CM | POA: Insufficient documentation

## 2011-05-13 ENCOUNTER — Ambulatory Visit: Payer: No Typology Code available for payment source

## 2011-05-19 ENCOUNTER — Ambulatory Visit: Payer: No Typology Code available for payment source

## 2011-06-03 ENCOUNTER — Ambulatory Visit: Payer: No Typology Code available for payment source | Attending: Family Medicine

## 2011-06-03 DIAGNOSIS — M25569 Pain in unspecified knee: Secondary | ICD-10-CM | POA: Insufficient documentation

## 2011-06-03 DIAGNOSIS — R5381 Other malaise: Secondary | ICD-10-CM | POA: Insufficient documentation

## 2011-06-03 DIAGNOSIS — IMO0001 Reserved for inherently not codable concepts without codable children: Secondary | ICD-10-CM | POA: Insufficient documentation

## 2011-06-03 DIAGNOSIS — M545 Low back pain, unspecified: Secondary | ICD-10-CM | POA: Insufficient documentation

## 2011-06-18 ENCOUNTER — Emergency Department (INDEPENDENT_AMBULATORY_CARE_PROVIDER_SITE_OTHER): Payer: BC Managed Care – PPO

## 2011-06-18 ENCOUNTER — Emergency Department (HOSPITAL_BASED_OUTPATIENT_CLINIC_OR_DEPARTMENT_OTHER)
Admission: EM | Admit: 2011-06-18 | Discharge: 2011-06-18 | Disposition: A | Discharge status: Other Acute Inpatient Hospital | Payer: BC Managed Care – PPO | Source: Home / Self Care | Attending: Emergency Medicine | Admitting: Emergency Medicine

## 2011-06-18 ENCOUNTER — Observation Stay (HOSPITAL_COMMUNITY)
Admission: EM | Admit: 2011-06-18 | Discharge: 2011-06-18 | Disposition: A | Discharge status: Home / Self Care | Payer: BC Managed Care – PPO | Attending: Orthopedic Surgery | Admitting: Orthopedic Surgery

## 2011-06-18 DIAGNOSIS — IMO0002 Reserved for concepts with insufficient information to code with codable children: Secondary | ICD-10-CM

## 2011-06-18 DIAGNOSIS — S61209A Unspecified open wound of unspecified finger without damage to nail, initial encounter: Secondary | ICD-10-CM | POA: Insufficient documentation

## 2011-06-18 DIAGNOSIS — Z01810 Encounter for preprocedural cardiovascular examination: Secondary | ICD-10-CM | POA: Insufficient documentation

## 2011-06-18 DIAGNOSIS — S62639B Displaced fracture of distal phalanx of unspecified finger, initial encounter for open fracture: Principal | ICD-10-CM | POA: Insufficient documentation

## 2011-06-18 DIAGNOSIS — M79609 Pain in unspecified limb: Secondary | ICD-10-CM

## 2011-06-18 DIAGNOSIS — Y998 Other external cause status: Secondary | ICD-10-CM | POA: Insufficient documentation

## 2011-06-18 DIAGNOSIS — Y92009 Unspecified place in unspecified non-institutional (private) residence as the place of occurrence of the external cause: Secondary | ICD-10-CM | POA: Insufficient documentation

## 2011-06-18 DIAGNOSIS — S62639A Displaced fracture of distal phalanx of unspecified finger, initial encounter for closed fracture: Secondary | ICD-10-CM

## 2011-06-18 DIAGNOSIS — Y93H3 Activity, building and construction: Secondary | ICD-10-CM | POA: Insufficient documentation

## 2011-06-18 DIAGNOSIS — W298XXA Contact with other powered powered hand tools and household machinery, initial encounter: Secondary | ICD-10-CM | POA: Insufficient documentation

## 2011-06-18 LAB — CBC
HCT: 40.4 % (ref 39.0–52.0)
Hemoglobin: 13.9 g/dL (ref 13.0–17.0)
MCHC: 34.4 g/dL (ref 30.0–36.0)
MCV: 89.8 fL (ref 78.0–100.0)
RDW: 12.4 % (ref 11.5–15.5)

## 2011-06-18 LAB — DIFFERENTIAL
Basophils Absolute: 0 10*3/uL (ref 0.0–0.1)
Eosinophils Relative: 2 % (ref 0–5)
Lymphocytes Relative: 39 % (ref 12–46)
Lymphs Abs: 3 10*3/uL (ref 0.7–4.0)
Monocytes Absolute: 0.7 10*3/uL (ref 0.1–1.0)
Monocytes Relative: 9 % (ref 3–12)
Neutro Abs: 3.7 10*3/uL (ref 1.7–7.7)

## 2011-06-18 LAB — BASIC METABOLIC PANEL
BUN: 20 mg/dL (ref 6–23)
CO2: 24 mEq/L (ref 19–32)
Chloride: 102 mEq/L (ref 96–112)
Creatinine, Ser: 0.8 mg/dL (ref 0.50–1.35)
GFR calc Af Amer: >60 mL/min (ref >60)
Potassium: 4 mEq/L (ref 3.5–5.1)

## 2011-06-18 LAB — PROTIME-INR: INR: 0.94 (ref 0.00–1.49)

## 2011-06-19 NOTE — H&P (Signed)
NAMEJAQUAVIS, Willie Elliott                  ACCOUNT NO.:  [id number]  MEDICAL RECORD NO.:  [id number]  LOCATION:  2550                         FACILITY:  [facility]  PHYSICIAN:  Madelynn Done, MD  DATE OF BIRTH:  February 06, 1961  DATE OF ADMISSION:  06/18/2011 DATE OF DISCHARGE:  06/18/2011                             HISTORY & PHYSICAL   REASON FOR SURGICAL INTERVENTION:  Right index and thumb table saw injury.  BRIEF HISTORY:  Willie Elliott is a 50 year old right-hand-dominant gentleman who was working at home and sustained a closed injury to his right thumb and index finger from a table saw.  The patient was seen at a [facility], transferred to [facility] for definitive treatment.  The patient was seen and evaluated in the emergency department.  The patient based on the given severity of his injuries, it was recommended the patient be taken to the operating room on the day of presentation for repair of the injured digits.  PAST MEDICAL HISTORY:  No major medical illnesses, chronic low back pain.  PAST SURGICAL HISTORY:  None.  MEDICATIONS:  Vicodin.  ALLERGIES:  None.  SOCIAL HISTORY:  He quit smoking 3 years ago.  He is a nondrinker.  He lives in town.  He is married.  REVIEW OF SYSTEMS:  No recent illnesses or hospitalizations.  PHYSICAL EXAMINATION:  GENERAL:  He is a healthy-appearing male. VITAL SIGNS:  Temperature 98.3, blood pressure 137/103, heart rate is 70, respirations 20.  He has had good hand coordination in his left hand, normal mood.  He is alert and oriented to person, place, and time, in no acute distress. HEENT:  Head atraumatic.  Pupils equal, round.  Sclerae are white. NECK:  No gross masses.  No wounds. CHEST:  Equal chest expansion.  No audible wheezing. CARDIOVASCULAR: Regular radial pulse. ABDOMEN:  Soft, nontender. EXTREMITIES:  Right upper extremity, the patient does have the lacerations to the dorsal aspect of the index finger at the level of  the zone 3.  He is able to actively extend his digit, does not appear to go all the way down to the bone.  Fingertips are warm and well perfused and he has no injuries to the long ring, small, and he has good wrist mobility and good forearm rotation.  He does have the oblique laceration to the distal thumb tip which goes all the way through the ulnar aspect of the thumb exiting dorsally.  The thumb tip does have good blood flow. He is able to gently flex the thumb, extend his thumb.  His fingertips are warm and well perfused.  Sensation to light touch is present along the radial and ulnar aspect of the index finger, slightly diminished on the ulnar aspect of the thumb.  He does not have any other injuries.  His radiographs did show the soft tissue injury to the thumb and to the index finger with the small fracture to the thumb, distal phalanx.  IMPRESSION:  Right thumb and index finger table saw injuries with open wounds and open fracture to the right thumb, distal phalanx.  POSTOPERATIVE PLAN:  Today, the findings were reviewed with Willie Elliott  and his tetanus is up to date.  He receive IV antibiotics.  He will be taken to the operating suite for thorough wound cleaning, repair of the indicated structures perhaps in the index finger, extensor tendon repair.  Also he was taken first to the thumb for aggressive debridement and potential open reduction and internal fixation and a reconstruction of thumb tip.  We talked about the risks of surgery to include, but not limited to bleeding, infection, damage to nearby nerves, arteries, tendons, loss of motion of the wrists and digits and he is referred to surgical intervention.  Signed informed consent was obtained.  The patient will be discharged to home following procedure.  Follow closely as an outpatient.     Madelynn Done, MD     FWO/MEDQ  D:  06/18/2011  T:  06/19/2011  Job:  [id number]  Electronically Signed by Bradly Bienenstock IV MD  on 06/19/2011 08:47:26 PM

## 2011-06-19 NOTE — Op Note (Signed)
Willie Elliott, Willie Elliott                  ACCOUNT NO.:  [id number]  MEDICAL RECORD NO.:  [id number]  LOCATION:  2550                         FACILITY:  [facility]  PHYSICIAN:  Madelynn Done, MD  DATE OF BIRTH:  18-May-1961  DATE OF PROCEDURE:  06/18/2011 DATE OF DISCHARGE:  06/18/2011                              OPERATIVE REPORT   PREOPERATIVE DIAGNOSES: 1. Right index finger laceration with tendon involvement. 2. Right thumb laceration with open distal phalanx fracture and nail     bed injury from a table saw.  ATTENDING PHYSICIAN:  Madelynn Done, MD, who scrubbed and present for the entire procedure.  ASSISTANT SURGEON:  None.  ANESTHESIA:  Supraclavicular block, performed by Dr. Gelene Mink, with IV sedation.  SURGICAL PROCEDURES: 1. Right index finger debridement of skin and subcutaneous tissue. 2. Right index finger repair of extensor tendon zone II. 3. Right index finger repair of a complicated wound, 3 x 3-cm open     wound. 4. Right thumb debridement of open fracture, distal phalanx,     debridement of skin, subcutaneous tissue, and bone. 5. Open treatment of right thumb distal phalanx fracture without     internal fixation. 6. Right thumb nail bed repair. 7. Right complex thumb laceration repair, traumatic laceration 5 cm.  SURGICAL INDICATIONS:  Willie Elliott is a 50 year old right-hand-dominant gentleman who was working on a table saw at home when he sustained the above-mentioned injuries.  The patient was seen and evaluated in the emergency department.  It was recommended that he undergo the above procedures.  Risks, benefits, and alternatives were discussed in detail with the patient and signed informed consent was obtained.  Risks include, but not limited to, bleeding, infection, damage to nearby nerves, arteries, or tendons, loss of motion of wrist and digits, persistent infection, and need for further surgical intervention.  DESCRIPTION OF THE PROCEDURE:  The  patient was appropriately identified in the preop holding area and a mark with permanent marker was made on the right thumb to indicate correct operative site.  The patient was then brought back to operating room, placed supine on the anesthesia room table where the supraclavicular block had been performed.  The patient tolerated this well.  IV sedation was administered.  A well- padded tourniquet was then placed on the right brachium and sealed with a 1000 drape.  Right upper extremity was then prepped and draped and sealed with a 1000 drape.  The right upper extremity was then prepped and draped in a sterile fashion.  Time-out was called.  The correct side was identified and the procedure was then begun.  Attention was then turned to right index finger where debridement of skin, subcutaneous tissue was then carried out of the superior complex laceration. Dissection was then carried down the extensor tendon where the patient did have an approximately 50% laceration of extensor tendon in zone II. Using several figure-of-eight and horizontal mattress of 4-0 Mersilene sutures, the extensor mechanism was then repaired.  Following this, complex laceration was then repaired, 3-cm defect of the dorsal aspect of the index finger, this was done with simple nylon sutures.  The wound was then thoroughly  irrigated throughout.  After attention was turned to the index finger, we moved onto the thumb.  The patient did have a large ulnar-based flap that was opened up and debridement of the skin and subcutaneous tissue and bone associated with open fracture was then carried out.  The patient did have a small distal phalanx fracture that was not amenable to internal fixation.  Open treatment of the distal phalanx fracture was then carried out after the nail plate had been removed repairing the nail bed with a simple 5-0 chromic sutures.  With good repair of the nailbed, attention was then turned to the  complex laceration, where the 5-cm complex laceration was then repaired with simple nylon sutures.  The wound was then thoroughly irrigated throughout.  The tourniquet was inflated with good perfusion of the fingertips.  Adaptic dressings were then applied.  Sterile compressive bandage was then applied.  The patient was then placed in a finger splint, then taken to recovery room in good condition.  POSTOPERATIVE PLAN:  The patient is discharged home on oral pain medications and antibiotics.  Plan is to see him back in 9 days for a wound check and potential suture removal and then we will set up an outpatient therapy regimen for the zone II extensor mechanism and its tip protector splint for the thumb.     Madelynn Done, MD     FWO/MEDQ  D:  06/18/2011  T:  06/19/2011  Job:  [id number]  Electronically Signed by Bradly Bienenstock IV MD on 06/19/2011 08:47:21 PM

## 2011-07-15 NOTE — Discharge Summary (Signed)
  NAMESTEN, Willie Elliott NO.:  [id number]  MEDICAL RECORD NO.:  [id number]  LOCATION:  2550                         FACILITY:  [facility]  PHYSICIAN:  Madelynn Done, MD  DATE OF BIRTH:  27-Jun-1961  DATE OF ADMISSION:  06/18/2011 DATE OF DISCHARGE:  06/18/2011                              DISCHARGE SUMMARY   Willie Elliott came in on June 29 and went home on June 18, 2011.  The patient had a procedure in the operating room as outpatient procedure.  He was not admitted.  There was no admission to the hospital.     Madelynn Done, MD     FWO/MEDQ  D:  07/12/2011  T:  07/13/2011  Job:  [id number]  Electronically Signed by Bradly Bienenstock IV MD on 07/15/2011 08:02:22 AM

## 2011-09-24 LAB — DIFFERENTIAL
Eosinophils Absolute: 0.2
Eosinophils Relative: 3
Lymphocytes Relative: 32
Lymphs Abs: 1.6
Monocytes Absolute: 0.4

## 2011-09-24 LAB — I-STAT 8, (EC8 V) (CONVERTED LAB)
BUN: 8
Bicarbonate: 23
Chloride: 107
pCO2, Ven: 36.7 — ABNORMAL LOW
pH, Ven: 7.405 — ABNORMAL HIGH

## 2011-09-24 LAB — CBC
HCT: 41.6
Hemoglobin: 14.2
MCV: 92.4
RDW: 13.3
WBC: 5.1

## 2011-09-24 LAB — D-DIMER, QUANTITATIVE: D-Dimer, Quant: 0.29

## 2011-09-24 LAB — POCT I-STAT CREATININE
Creatinine, Ser: 0.8
Operator id: 265201

## 2011-09-24 LAB — POCT CARDIAC MARKERS
Myoglobin, poc: 80.2
Operator id: 265201

## 2012-12-20 HISTORY — PX: KNEE ARTHROCENTESIS: SUR44

## 2013-02-14 ENCOUNTER — Emergency Department (HOSPITAL_COMMUNITY)
Admission: EM | Admit: 2013-02-14 | Discharge: 2013-02-14 | Disposition: A | Discharge status: Home / Self Care | Payer: BC Managed Care – PPO | Attending: Emergency Medicine | Admitting: Emergency Medicine

## 2013-02-14 ENCOUNTER — Encounter (HOSPITAL_COMMUNITY): Payer: Self-pay | Admitting: Emergency Medicine

## 2013-02-14 ENCOUNTER — Emergency Department (HOSPITAL_COMMUNITY): Payer: BC Managed Care – PPO

## 2013-02-14 DIAGNOSIS — S81011A Laceration without foreign body, right knee, initial encounter: Secondary | ICD-10-CM

## 2013-02-14 DIAGNOSIS — M79601 Pain in right arm: Secondary | ICD-10-CM

## 2013-02-14 DIAGNOSIS — Z8679 Personal history of other diseases of the circulatory system: Secondary | ICD-10-CM | POA: Insufficient documentation

## 2013-02-14 DIAGNOSIS — S59919A Unspecified injury of unspecified forearm, initial encounter: Secondary | ICD-10-CM | POA: Insufficient documentation

## 2013-02-14 DIAGNOSIS — Z79899 Other long term (current) drug therapy: Secondary | ICD-10-CM | POA: Insufficient documentation

## 2013-02-14 DIAGNOSIS — Y9389 Activity, other specified: Secondary | ICD-10-CM | POA: Insufficient documentation

## 2013-02-14 DIAGNOSIS — E669 Obesity, unspecified: Secondary | ICD-10-CM | POA: Insufficient documentation

## 2013-02-14 DIAGNOSIS — S6990XA Unspecified injury of unspecified wrist, hand and finger(s), initial encounter: Secondary | ICD-10-CM | POA: Insufficient documentation

## 2013-02-14 DIAGNOSIS — S91009A Unspecified open wound, unspecified ankle, initial encounter: Secondary | ICD-10-CM | POA: Insufficient documentation

## 2013-02-14 DIAGNOSIS — I10 Essential (primary) hypertension: Secondary | ICD-10-CM | POA: Insufficient documentation

## 2013-02-14 DIAGNOSIS — Z87891 Personal history of nicotine dependence: Secondary | ICD-10-CM | POA: Insufficient documentation

## 2013-02-14 DIAGNOSIS — S81009A Unspecified open wound, unspecified knee, initial encounter: Secondary | ICD-10-CM | POA: Insufficient documentation

## 2013-02-14 DIAGNOSIS — S59909A Unspecified injury of unspecified elbow, initial encounter: Secondary | ICD-10-CM | POA: Insufficient documentation

## 2013-02-14 HISTORY — DX: Essential (primary) hypertension: I10

## 2013-02-14 MED ORDER — OXYCODONE-ACETAMINOPHEN 5-325 MG PO TABS
2.0000 | ORAL_TABLET | Freq: Once | ORAL | Status: AC
  Administered 2013-02-14: 2 via ORAL
  Filled 2013-02-14: qty 2

## 2013-02-14 MED ORDER — OXYCODONE-ACETAMINOPHEN 5-325 MG PO TABS: 1.0000 | ORAL_TABLET | Freq: Four times a day (QID) | ORAL | Status: DC | PRN

## 2013-02-14 NOTE — Progress Notes (Signed)
Orthopedic Tech Progress Note Patient Details:  Willie Elliott [facility] October 04, 1961 [id number]  Ortho Devices Type of Ortho Device: Arm sling Ortho Device/Splint Location: right arm Ortho Device/Splint Interventions: Application   Willie Elliott 02/14/2013, 10:36 PM
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
## 2013-02-14 NOTE — ED Notes (Signed)
Paged ortho 
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
## 2013-02-14 NOTE — ED Notes (Signed)
Spoke with ortho, will be here shortly.

## 2013-02-15 NOTE — ED Provider Notes (Signed)
Medical screening examination/treatment/procedure(s) were performed by non-physician practitioner and as supervising physician I was immediately available for consultation/collaboration.   David H Yao, MD 02/15/13 1454 

## 2013-04-18 ENCOUNTER — Other Ambulatory Visit: Payer: Self-pay | Admitting: Orthopedic Surgery

## 2013-04-19 ENCOUNTER — Encounter (HOSPITAL_BASED_OUTPATIENT_CLINIC_OR_DEPARTMENT_OTHER): Payer: Self-pay | Admitting: *Deleted
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
## 2013-04-23 ENCOUNTER — Ambulatory Visit (HOSPITAL_BASED_OUTPATIENT_CLINIC_OR_DEPARTMENT_OTHER): Payer: BC Managed Care – PPO | Admitting: Anesthesiology

## 2013-04-23 ENCOUNTER — Encounter (HOSPITAL_BASED_OUTPATIENT_CLINIC_OR_DEPARTMENT_OTHER)
Admission: RE | Disposition: A | Discharge status: Home / Self Care | Payer: Self-pay | Source: Ambulatory Visit | Attending: Orthopedic Surgery

## 2013-04-23 ENCOUNTER — Encounter (HOSPITAL_BASED_OUTPATIENT_CLINIC_OR_DEPARTMENT_OTHER): Payer: Self-pay | Admitting: *Deleted

## 2013-04-23 ENCOUNTER — Ambulatory Visit (HOSPITAL_BASED_OUTPATIENT_CLINIC_OR_DEPARTMENT_OTHER)
Admission: RE | Admit: 2013-04-23 | Discharge: 2013-04-23 | Disposition: A | Discharge status: Home / Self Care | Payer: BC Managed Care – PPO | Source: Ambulatory Visit | Attending: Orthopedic Surgery | Admitting: Orthopedic Surgery

## 2013-04-23 ENCOUNTER — Encounter (HOSPITAL_BASED_OUTPATIENT_CLINIC_OR_DEPARTMENT_OTHER): Payer: Self-pay | Admitting: Anesthesiology

## 2013-04-23 DIAGNOSIS — M67919 Unspecified disorder of synovium and tendon, unspecified shoulder: Secondary | ICD-10-CM | POA: Insufficient documentation

## 2013-04-23 DIAGNOSIS — Z6839 Body mass index (BMI) 39.0-39.9, adult: Secondary | ICD-10-CM | POA: Insufficient documentation

## 2013-04-23 DIAGNOSIS — S43429A Sprain of unspecified rotator cuff capsule, initial encounter: Secondary | ICD-10-CM | POA: Insufficient documentation

## 2013-04-23 DIAGNOSIS — M659 Unspecified synovitis and tenosynovitis, unspecified site: Secondary | ICD-10-CM | POA: Insufficient documentation

## 2013-04-23 DIAGNOSIS — Z791 Long term (current) use of non-steroidal anti-inflammatories (NSAID): Secondary | ICD-10-CM | POA: Insufficient documentation

## 2013-04-23 DIAGNOSIS — Z87891 Personal history of nicotine dependence: Secondary | ICD-10-CM | POA: Insufficient documentation

## 2013-04-23 DIAGNOSIS — M129 Arthropathy, unspecified: Secondary | ICD-10-CM | POA: Insufficient documentation

## 2013-04-23 DIAGNOSIS — M75101 Unspecified rotator cuff tear or rupture of right shoulder, not specified as traumatic: Secondary | ICD-10-CM

## 2013-04-23 DIAGNOSIS — I119 Hypertensive heart disease without heart failure: Secondary | ICD-10-CM | POA: Insufficient documentation

## 2013-04-23 DIAGNOSIS — M719 Bursopathy, unspecified: Secondary | ICD-10-CM | POA: Insufficient documentation

## 2013-04-23 DIAGNOSIS — J309 Allergic rhinitis, unspecified: Secondary | ICD-10-CM | POA: Insufficient documentation

## 2013-04-23 DIAGNOSIS — Z79899 Other long term (current) drug therapy: Secondary | ICD-10-CM | POA: Insufficient documentation

## 2013-04-23 DIAGNOSIS — E669 Obesity, unspecified: Secondary | ICD-10-CM | POA: Insufficient documentation

## 2013-04-23 DIAGNOSIS — Z885 Allergy status to narcotic agent status: Secondary | ICD-10-CM | POA: Insufficient documentation

## 2013-04-23 HISTORY — DX: Unspecified osteoarthritis, unspecified site: M19.90

## 2013-04-23 HISTORY — DX: Other seasonal allergic rhinitis: J30.2

## 2013-04-23 HISTORY — PX: SHOULDER ARTHROSCOPY WITH ROTATOR CUFF REPAIR AND SUBACROMIAL DECOMPRESSION: SHX5686

## 2013-04-23 SURGERY — SHOULDER ARTHROSCOPY WITH ROTATOR CUFF REPAIR AND SUBACROMIAL DECOMPRESSION
Anesthesia: General | Site: Shoulder | Laterality: Right | Wound class: Clean

## 2013-04-23 MED ORDER — POVIDONE-IODINE 7.5 % EX SOLN
Freq: Once | CUTANEOUS | Status: AC
  Administered 2013-04-23: 11:00:00 via TOPICAL

## 2013-04-23 MED ORDER — FENTANYL CITRATE 0.05 MG/ML IJ SOLN
50.0000 ug | INTRAMUSCULAR | Status: DC | PRN
  Administered 2013-04-23: 100 ug via INTRAVENOUS

## 2013-04-23 MED ORDER — MIDAZOLAM HCL 2 MG/2ML IJ SOLN: 0.5000 mg | Freq: Once | INTRAMUSCULAR | Status: DC | PRN

## 2013-04-23 MED ORDER — ONDANSETRON HCL 4 MG/2ML IJ SOLN
INTRAMUSCULAR | Status: DC | PRN
  Administered 2013-04-23: 4 mg via INTRAVENOUS

## 2013-04-23 MED ORDER — LACTATED RINGERS IV SOLN
INTRAVENOUS | Status: DC
  Administered 2013-04-23 (×2): via INTRAVENOUS

## 2013-04-23 MED ORDER — LIDOCAINE HCL (CARDIAC) 20 MG/ML IV SOLN
INTRAVENOUS | Status: DC | PRN
  Administered 2013-04-23: 30 mg via INTRAVENOUS

## 2013-04-23 MED ORDER — DEXAMETHASONE SODIUM PHOSPHATE 4 MG/ML IJ SOLN
INTRAMUSCULAR | Status: DC | PRN
  Administered 2013-04-23: 10 mg via INTRAVENOUS

## 2013-04-23 MED ORDER — BUPIVACAINE-EPINEPHRINE PF 0.5-1:200000 % IJ SOLN
INTRAMUSCULAR | Status: DC | PRN
  Administered 2013-04-23: 30 mL

## 2013-04-23 MED ORDER — OXYCODONE HCL 5 MG PO TABS
5.0000 mg | ORAL_TABLET | Freq: Once | ORAL | Status: AC | PRN
  Administered 2013-04-23: 5 mg via ORAL

## 2013-04-23 MED ORDER — FENTANYL CITRATE 0.05 MG/ML IJ SOLN
INTRAMUSCULAR | Status: DC | PRN
  Administered 2013-04-23 (×2): 50 ug via INTRAVENOUS

## 2013-04-23 MED ORDER — ACETAMINOPHEN 10 MG/ML IV SOLN
1000.0000 mg | Freq: Once | INTRAVENOUS | Status: AC
  Administered 2013-04-23: 1000 mg via INTRAVENOUS

## 2013-04-23 MED ORDER — PROMETHAZINE HCL 25 MG/ML IJ SOLN: 6.2500 mg | INTRAMUSCULAR | Status: DC | PRN

## 2013-04-23 MED ORDER — MIDAZOLAM HCL 2 MG/2ML IJ SOLN
1.0000 mg | INTRAMUSCULAR | Status: DC | PRN
  Administered 2013-04-23: 2 mg via INTRAVENOUS

## 2013-04-23 MED ORDER — HYDROMORPHONE HCL PF 1 MG/ML IJ SOLN
0.2500 mg | INTRAMUSCULAR | Status: DC | PRN
  Administered 2013-04-23 (×2): 0.5 mg via INTRAVENOUS

## 2013-04-23 MED ORDER — CEFAZOLIN SODIUM-DEXTROSE 2-3 GM-% IV SOLR
2.0000 g | INTRAVENOUS | Status: AC
Start: 2013-04-23 — End: 2013-04-23
  Administered 2013-04-23: 2 g via INTRAVENOUS

## 2013-04-23 MED ORDER — PROPOFOL 10 MG/ML IV BOLUS
INTRAVENOUS | Status: DC | PRN
  Administered 2013-04-23: 200 mg via INTRAVENOUS

## 2013-04-23 MED ORDER — GLYCOPYRROLATE 0.2 MG/ML IJ SOLN
INTRAMUSCULAR | Status: DC | PRN
  Administered 2013-04-23: 0.2 mg via INTRAVENOUS

## 2013-04-23 MED ORDER — OXYCODONE HCL 5 MG/5ML PO SOLN: 5.0000 mg | Freq: Once | ORAL | Status: AC | PRN

## 2013-04-23 MED ORDER — OXYCODONE-ACETAMINOPHEN 5-325 MG PO TABS: 1.0000 | ORAL_TABLET | ORAL | Status: DC | PRN

## 2013-04-23 MED ORDER — MEPERIDINE HCL 25 MG/ML IJ SOLN: 6.2500 mg | INTRAMUSCULAR | Status: DC | PRN

## 2013-04-23 MED ORDER — SUCCINYLCHOLINE CHLORIDE 20 MG/ML IJ SOLN
INTRAMUSCULAR | Status: DC | PRN
  Administered 2013-04-23: 100 mg via INTRAVENOUS

## 2013-04-23 SURGICAL SUPPLY — 83 items
ADH SKN CLS APL DERMABOND .7 (GAUZE/BANDAGES/DRESSINGS)
ANCH SUT SWLK 19.1X4.75 (Anchor) ×1 IMPLANT
ANCHOR SUT BIO SW 4.75X19.1 (Anchor) ×1 IMPLANT
APL SKNCLS STERI-STRIP NONHPOA (GAUZE/BANDAGES/DRESSINGS)
BENZOIN TINCTURE PRP APPL 2/3 (GAUZE/BANDAGES/DRESSINGS) IMPLANT
BLADE SURG 15 STRL LF DISP TIS (BLADE) IMPLANT
BLADE SURG 15 STRL SS (BLADE)
BLADE SURG ROTATE 9660 (MISCELLANEOUS) IMPLANT
BUR OVAL 4.0 (BURR) ×2 IMPLANT
CANISTER OMNI JUG 16 LITER (MISCELLANEOUS) ×2 IMPLANT
CANISTER SUCTION 2500CC (MISCELLANEOUS) IMPLANT
CANNULA 5.75X71 LONG (CANNULA) ×2 IMPLANT
CANNULA TWIST IN 8.25X7CM (CANNULA) ×1 IMPLANT
CHLORAPREP W/TINT 26ML (MISCELLANEOUS) ×2 IMPLANT
CLOTH BEACON ORANGE TIMEOUT ST (SAFETY) ×2 IMPLANT
DECANTER SPIKE VIAL GLASS SM (MISCELLANEOUS) IMPLANT
DERMABOND ADVANCED (GAUZE/BANDAGES/DRESSINGS)
DERMABOND ADVANCED .7 DNX12 (GAUZE/BANDAGES/DRESSINGS) IMPLANT
DRAPE INCISE IOBAN 66X45 STRL (DRAPES) ×2 IMPLANT
DRAPE STERI 35X30 U-POUCH (DRAPES) ×2 IMPLANT
DRAPE SURG 17X23 STRL (DRAPES) ×2 IMPLANT
DRAPE U 20/CS (DRAPES) ×2 IMPLANT
DRAPE U-SHAPE 47X51 STRL (DRAPES) ×2 IMPLANT
DRAPE U-SHAPE 76X120 STRL (DRAPES) ×4 IMPLANT
DRSG PAD ABDOMINAL 8X10 ST (GAUZE/BANDAGES/DRESSINGS) ×2 IMPLANT
ELECT REM PT RETURN 9FT ADLT (ELECTROSURGICAL) ×2
ELECTRODE REM PT RTRN 9FT ADLT (ELECTROSURGICAL) ×1 IMPLANT
GAUZE SPONGE 4X4 16PLY XRAY LF (GAUZE/BANDAGES/DRESSINGS) IMPLANT
GAUZE XEROFORM 1X8 LF (GAUZE/BANDAGES/DRESSINGS) ×2 IMPLANT
GLOVE BIO SURGEON STRL SZ 6.5 (GLOVE) ×2 IMPLANT
GLOVE BIO SURGEON STRL SZ7 (GLOVE) ×2 IMPLANT
GLOVE BIO SURGEON STRL SZ7.5 (GLOVE) ×3 IMPLANT
GLOVE BIOGEL PI IND STRL 7.0 (GLOVE) ×1 IMPLANT
GLOVE BIOGEL PI IND STRL 8 (GLOVE) ×2 IMPLANT
GLOVE BIOGEL PI INDICATOR 7.0 (GLOVE) ×2
GLOVE BIOGEL PI INDICATOR 8 (GLOVE) ×1
GOWN PREVENTION PLUS XLARGE (GOWN DISPOSABLE) ×3 IMPLANT
IV NS IRRIG 3000ML ARTHROMATIC (IV SOLUTION) ×5 IMPLANT
NDL 1/2 CIR CATGUT .05X1.09 (NEEDLE) IMPLANT
NDL SCORPION MULTI FIRE (NEEDLE) IMPLANT
NDL SUT 6 .5 CRC .975X.05 MAYO (NEEDLE) IMPLANT
NEEDLE 1/2 CIR CATGUT .05X1.09 (NEEDLE) IMPLANT
NEEDLE MAYO TAPER (NEEDLE)
NEEDLE SCORPION MULTI FIRE (NEEDLE) ×2 IMPLANT
NS IRRIG 1000ML POUR BTL (IV SOLUTION) IMPLANT
PACK ARTHROSCOPY DSU (CUSTOM PROCEDURE TRAY) ×2 IMPLANT
PACK BASIN DAY SURGERY FS (CUSTOM PROCEDURE TRAY) ×2 IMPLANT
PENCIL BUTTON HOLSTER BLD 10FT (ELECTRODE) IMPLANT
RESECTOR FULL RADIUS 4.2MM (BLADE) ×2 IMPLANT
SLEEVE SCD COMPRESS KNEE MED (MISCELLANEOUS) ×2 IMPLANT
SLING ARM FOAM STRAP LRG (SOFTGOODS) ×1 IMPLANT
SLING ARM FOAM STRAP MED (SOFTGOODS) IMPLANT
SLING ARM FOAM STRAP XLG (SOFTGOODS) IMPLANT
SLING ARM IMMOBILIZER MED (SOFTGOODS) IMPLANT
SLING ULTRA II LARGE (SOFTGOODS) ×1 IMPLANT
SPONGE GAUZE 4X4 12PLY (GAUZE/BANDAGES/DRESSINGS) ×2 IMPLANT
SPONGE LAP 4X18 X RAY DECT (DISPOSABLE) IMPLANT
STRIP CLOSURE SKIN 1/2X4 (GAUZE/BANDAGES/DRESSINGS) IMPLANT
SUCTION FRAZIER TIP 10 FR DISP (SUCTIONS) IMPLANT
SUPPORT WRAP ARM LG (MISCELLANEOUS) ×1 IMPLANT
SUT BONE WAX W31G (SUTURE) IMPLANT
SUT ETHIBOND 2 OS 4 DA (SUTURE) IMPLANT
SUT ETHILON 3 0 PS 1 (SUTURE) ×2 IMPLANT
SUT ETHILON 4 0 PS 2 18 (SUTURE) IMPLANT
SUT FIBERWIRE #2 38 T-5 BLUE (SUTURE)
SUT FIBERWIRE 2-0 18 17.9 3/8 (SUTURE)
SUT MNCRL AB 3-0 PS2 18 (SUTURE) IMPLANT
SUT MNCRL AB 4-0 PS2 18 (SUTURE) IMPLANT
SUT PDS AB 0 CT 36 (SUTURE) IMPLANT
SUT PROLENE 3 0 PS 2 (SUTURE) IMPLANT
SUT VIC AB 0 CT1 18XCR BRD 8 (SUTURE) IMPLANT
SUT VIC AB 0 CT1 8-18 (SUTURE)
SUT VIC AB 2-0 SH 18 (SUTURE) IMPLANT
SUTURE FIBERWR #2 38 T-5 BLUE (SUTURE) IMPLANT
SUTURE FIBERWR 2-0 18 17.9 3/8 (SUTURE) IMPLANT
SYR BULB 3OZ (MISCELLANEOUS) IMPLANT
TOWEL OR 17X24 6PK STRL BLUE (TOWEL DISPOSABLE) ×2 IMPLANT
TOWEL OR NON WOVEN STRL DISP B (DISPOSABLE) ×2 IMPLANT
TUBE CONNECTING 20X1/4 (TUBING) ×2 IMPLANT
TUBING ARTHROSCOPY IRRIG 16FT (MISCELLANEOUS) ×2 IMPLANT
WAND STAR VAC 90 (SURGICAL WAND) ×2 IMPLANT
WATER STERILE IRR 1000ML POUR (IV SOLUTION) ×2 IMPLANT
YANKAUER SUCT BULB TIP NO VENT (SUCTIONS) IMPLANT

## 2013-04-23 NOTE — Anesthesia Preprocedure Evaluation (Addendum)
Anesthesia Evaluation  Patient identified by MRN, date of birth, ID band Patient awake    Reviewed: Allergy & Precautions, H&P , NPO status , Patient's Chart, lab work & pertinent test results  History of Anesthesia Complications Negative for: history of anesthetic complications  Airway Mallampati: I TM Distance: >3 FB Neck ROM: Full    Dental  (+) Teeth Intact, Dental Advisory Given and Caps   Pulmonary former smoker (quit 5 years),  breath sounds clear to auscultation  Pulmonary exam normal       Cardiovascular hypertension (no meds), negative cardio ROS  Rhythm:Regular Rate:Normal  '08 ECHO: normal LVF, EF 55%, valves OK '09 cath: minimal non-obst ASCADz   Neuro/Psych negative neurological ROS  negative psych ROS   GI/Hepatic negative GI ROS, Neg liver ROS,   Endo/Other  Morbid obesity  Renal/GU negative Renal ROS     Musculoskeletal   Abdominal (+) + obese,   Peds  Hematology   Anesthesia Other Findings   Reproductive/Obstetrics                          Anesthesia Physical Anesthesia Plan  ASA: II  Anesthesia Plan: General   Post-op Pain Management:    Induction: Intravenous  Airway Management Planned: Oral ETT  Additional Equipment:   Intra-op Plan:   Post-operative Plan: Extubation in OR  Informed Consent: I have reviewed the patients History and Physical, chart, labs and discussed the procedure including the risks, benefits and alternatives for the proposed anesthesia with the patient or authorized representative who has indicated his/her understanding and acceptance.   Dental advisory given  Plan Discussed with: Surgeon and CRNA  Anesthesia Plan Comments: (Plan routine monitors, GETA with interscalene block for post op analgesia)        Anesthesia Quick Evaluation

## 2013-04-23 NOTE — Anesthesia Postprocedure Evaluation (Signed)
  Anesthesia Post-op Note  Patient: Willie Elliott  Procedure(s) Performed: Procedure(s): SHOULDER ARTHROSCOPY WITH ROTATOR CUFF REPAIR AND SUBACROMIAL DECOMPRESSION (Right)  Patient Location: PACU  Anesthesia Type:GA combined with regional for post-op pain  Level of Consciousness: awake, alert , oriented and patient cooperative  Airway and Oxygen Therapy: Patient Spontanous Breathing  Post-op Pain: none  Post-op Assessment: Post-op Vital signs reviewed, Patient's Cardiovascular Status Stable, Respiratory Function Stable, Patent Airway, No signs of Nausea or vomiting and Pain level controlled  Post-op Vital Signs: Reviewed and stable  Complications: No apparent anesthesia complications
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
## 2013-04-23 NOTE — Anesthesia Procedure Notes (Addendum)
Anesthesia Regional Block:  Interscalene brachial plexus block  Pre-Anesthetic Checklist: ,, timeout performed, Correct Patient, Correct Site, Correct Laterality, Correct Procedure, Correct Position, site marked, Risks and benefits discussed,  Surgical consent,  Pre-op evaluation,  At surgeon's request and post-op pain management  Laterality: Right  Prep: chloraprep       Needles:  Injection technique: Single-shot  Needle Type: Stimulator Needle - 40     Needle Length: 4cm  Needle Gauge: 22 and 22 G    Additional Needles:  Procedures: nerve stimulator Interscalene brachial plexus block  Nerve Stimulator or Paresthesia:  Response: forearm twitch, 0.45 mA, 0.1 ms,   Additional Responses:   Narrative:  Start time: 04/23/2013 10:55 AM End time: 04/23/2013 11:01 AM Injection made incrementally with aspirations every 5 mL.  Performed by: Personally  Anesthesiologist: Sandford Craze, MD  Additional Notes: Pt identified in Holding room.  Monitors applied. Working IV access confirmed. Sterile prep R neck.  #22ga PNS to forearm twitch at 0.41mA threshold.  30cc 0.5% Bupivacaine with 1:200k epi injected incrementally after negative test dose.  Patient asymptomatic, VSS, no heme aspirated, tolerated well.    Interscalene brachial plexus block Procedure Name: Intubation Date/Time: 04/23/2013 11:50 AM Performed by: Gar Gibbon Pre-anesthesia Checklist: Patient identified, Emergency Drugs available, Suction available and Patient being monitored Patient Re-evaluated:Patient Re-evaluated prior to inductionOxygen Delivery Method: Circle System Utilized Preoxygenation: Pre-oxygenation with 100% oxygen Intubation Type: IV induction Ventilation: Mask ventilation without difficulty Laryngoscope Size: Miller and 3 Tube type: Oral Tube size: 8.0 mm Number of attempts: 1 Airway Equipment and Method: stylet and oral airway Placement Confirmation: ETT inserted through vocal cords under direct  vision,  positive ETCO2 and breath sounds checked- equal and bilateral Secured at: 24 cm Tube secured with: Tape Dental Injury: Teeth and Oropharynx as per pre-operative assessment
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
## 2013-04-23 NOTE — Progress Notes (Signed)
Assisted Dr. Jackson with right, interscalene  block. Side rails up, monitors on throughout procedure. See vital signs in flow sheet. Tolerated Procedure well. 

## 2013-04-23 NOTE — Transfer of Care (Signed)
Immediate Anesthesia Transfer of Care Note  Patient: Willie Elliott [facility]  Procedure(s) Performed: Procedure(s): SHOULDER ARTHROSCOPY WITH ROTATOR CUFF REPAIR AND SUBACROMIAL DECOMPRESSION (Right)  Patient Location: PACU  Anesthesia Type:GA combined with regional for post-op pain  Level of Consciousness: awake and sedated  Airway & Oxygen Therapy: Patient Spontanous Breathing and Patient connected to face mask oxygen  Post-op Assessment: Report given to PACU RN and Post -op Vital signs reviewed and stable  Post vital signs: Reviewed and stable  Complications: No apparent anesthesia complications

## 2013-04-25 ENCOUNTER — Encounter (HOSPITAL_BASED_OUTPATIENT_CLINIC_OR_DEPARTMENT_OTHER): Payer: Self-pay | Admitting: Orthopedic Surgery

## 2015-11-19 ENCOUNTER — Encounter: Payer: Self-pay | Admitting: Internal Medicine

## 2016-01-15 ENCOUNTER — Ambulatory Visit (AMBULATORY_SURGERY_CENTER): Payer: Self-pay

## 2016-01-15 VITALS — Ht 68.0 in | Wt 270.0 lb

## 2016-01-15 DIAGNOSIS — Z1211 Encounter for screening for malignant neoplasm of colon: Secondary | ICD-10-CM

## 2016-01-15 MED ORDER — NA SULFATE-K SULFATE-MG SULF 17.5-3.13-1.6 GM/177ML PO SOLN
1.0000 | Freq: Once | ORAL | Status: DC
Start: 2016-01-15 — End: 2016-01-29

## 2016-01-15 NOTE — Progress Notes (Signed)
No egg or soy allergies Not on home 02 No previous anesthesia complications No diet or weight loss meds 

## 2016-01-29 ENCOUNTER — Encounter: Payer: Self-pay | Admitting: Internal Medicine

## 2016-01-29 ENCOUNTER — Ambulatory Visit (AMBULATORY_SURGERY_CENTER): Payer: BLUE CROSS/BLUE SHIELD | Admitting: Internal Medicine

## 2016-01-29 VITALS — BP 165/97 | HR 65 | Temp 97.1°F | Resp 17 | Ht 68.0 in | Wt 270.0 lb

## 2016-01-29 DIAGNOSIS — D128 Benign neoplasm of rectum: Secondary | ICD-10-CM

## 2016-01-29 DIAGNOSIS — D125 Benign neoplasm of sigmoid colon: Secondary | ICD-10-CM

## 2016-01-29 DIAGNOSIS — Z1211 Encounter for screening for malignant neoplasm of colon: Secondary | ICD-10-CM | POA: Diagnosis present

## 2016-01-29 DIAGNOSIS — K621 Rectal polyp: Secondary | ICD-10-CM | POA: Diagnosis not present

## 2016-01-29 MED ORDER — SODIUM CHLORIDE 0.9 % IV SOLN: 500.0000 mL | INTRAVENOUS | Status: DC

## 2016-01-29 NOTE — Progress Notes (Signed)
A/ox3 pleased with MAC, report to [facility]

## 2016-01-29 NOTE — Patient Instructions (Signed)
Discharge instructions given. Handouts on polyps and diverticulosis. Resume previous medications. YOU HAD AN ENDOSCOPIC PROCEDURE TODAY AT THE [facility]:   Refer to the procedure report that was given to you for any specific questions about what was found during the examination.  If the procedure report does not answer your questions, please call your gastroenterologist to clarify.  If you requested that your care partner not be given the details of your procedure findings, then the procedure report has been included in a sealed envelope for you to review at your convenience later.  YOU SHOULD EXPECT: Some feelings of bloating in the abdomen. Passage of more gas than usual.  Walking can help get rid of the air that was put into your GI tract during the procedure and reduce the bloating. If you had a lower endoscopy (such as a colonoscopy or flexible sigmoidoscopy) you may notice spotting of blood in your stool or on the toilet paper. If you underwent a bowel prep for your procedure, you may not have a normal bowel movement for a few days.  Please Note:  You might notice some irritation and congestion in your nose or some drainage.  This is from the oxygen used during your procedure.  There is no need for concern and it should clear up in a day or so.  SYMPTOMS TO REPORT IMMEDIATELY:   Following lower endoscopy (colonoscopy or flexible sigmoidoscopy):  Excessive amounts of blood in the stool  Significant tenderness or worsening of abdominal pains  Swelling of the abdomen that is new, acute  Fever of 100F or higher   For urgent or emergent issues, a gastroenterologist can be reached at any hour by calling [phone].   DIET: Your first meal following the procedure should be a small meal and then it is ok to progress to your normal diet. Heavy or fried foods are harder to digest and may make you feel nauseous or bloated.  Likewise, meals heavy in dairy and vegetables can  increase bloating.  Drink plenty of fluids but you should avoid alcoholic beverages for 24 hours.  ACTIVITY:  You should plan to take it easy for the rest of today and you should NOT DRIVE or use heavy machinery until tomorrow (because of the sedation medicines used during the test).    FOLLOW UP: Our staff will call the number listed on your records the next business day following your procedure to check on you and address any questions or concerns that you may have regarding the information given to you following your procedure. If we do not reach you, we will leave a message.  However, if you are feeling well and you are not experiencing any problems, there is no need to return our call.  We will assume that you have returned to your regular daily activities without incident.  If any biopsies were taken you will be contacted by phone or by letter within the next 1-3 weeks.  Please call us at [phone] if you have not heard about the biopsies in 3 weeks.    SIGNATURES/CONFIDENTIALITY: You and/or your care partner have signed paperwork which will be entered into your electronic medical record.  These signatures attest to the fact that that the information above on your After Visit Summary has been reviewed and is understood.  Full responsibility of the confidentiality of this discharge information lies with you and/or your care-partner. 

## 2016-01-29 NOTE — Op Note (Signed)
[facility]  [address] [place], [id number]   COLONOSCOPY PROCEDURE REPORT  PATIENT: Willie, Elliott  MR#: [id number] BIRTHDATE: 1961-03-06 , 54  yrs. old GENDER: male ENDOSCOPIST: Jerene Bears, MD REFERRED [id number] Reynaldo Minium, M.D. PROCEDURE DATE:  01/29/2016 PROCEDURE:   Colonoscopy, screening and Colonoscopy with snare polypectomy First Screening Colonoscopy - Avg.  risk and is 50 yrs.  old or older Yes.  Prior Negative Screening - Now for repeat screening. N/A  History of Adenoma - Now for follow-up colonoscopy & has been > or = to 3 yrs.  N/A  Polyps removed today? Yes ASA CLASS:   Class III INDICATIONS:Screening for colonic neoplasia, Colorectal Neoplasm Risk Assessment for this procedure is average risk, and 1st colonoscopy. MEDICATIONS: Monitored anesthesia care and Propofol 250 mg IV  DESCRIPTION OF PROCEDURE:   After the risks benefits and alternatives of the procedure were thoroughly explained, informed consent was obtained.  The digital rectal exam revealed no rectal mass.   The LB [id number]  endoscope was introduced through the anus and advanced to the cecum, which was identified by both the appendix and ileocecal valve. No adverse events experienced. The quality of the prep was good.  (Suprep was used)  The instrument was then slowly withdrawn as the colon was fully examined. Estimated blood loss is zero unless otherwise noted in this procedure report.   COLON FINDINGS: Three sessile polyps ranging between 3-20mm in size were found in the sigmoid colon and rectum.  Polypectomies were performed with a cold snare.  The resection was complete, the polyp tissue was completely retrieved and sent to histology.   There was mild diverticulosis noted at the splenic flexure.  Retroflexed views revealed small internal hemorrhoids. The time to cecum = 1.6 Withdrawal time = 15.8   The scope was withdrawn and the procedure completed. COMPLICATIONS:  There were no immediate complications.  ENDOSCOPIC IMPRESSION: 1.   Three sessile polyps ranging between 3-5mm in size were found in the sigmoid colon and rectum; polypectomies were performed with a cold snare 2.   Mild diverticulosis was noted at the splenic flexure  RECOMMENDATIONS: 1.  Await pathology results 2.  Timing of repeat colonoscopy will be determined by pathology findings. 3.  You will receive a letter within 1-2 weeks with the results of your biopsy as well as final recommendations.  Please call my office if you have not received a letter after 3 weeks.  eSigned:  Jerene Bears, MD 01/29/2016 9:32 AM  cc:  the patient, Dr. Reynaldo Minium

## 2016-01-29 NOTE — Progress Notes (Signed)
Called to room to assist during endoscopic procedure.  Patient ID and intended procedure confirmed with present staff. Received instructions for my participation in the procedure from the performing physician.  

## 2016-01-30 ENCOUNTER — Telehealth: Payer: Self-pay

## 2016-01-30 ENCOUNTER — Other Ambulatory Visit: Payer: Self-pay

## 2016-01-30 DIAGNOSIS — R1084 Generalized abdominal pain: Secondary | ICD-10-CM

## 2016-01-30 NOTE — Telephone Encounter (Signed)
Left message for pt to call back on both phone numbers.

## 2016-01-30 NOTE — Telephone Encounter (Signed)
I would not expect that he would have pain at 5/10 today Please ensure no fever, chills Also recommend flat and upright abd xray to r/o complication from colonoscopy yesterday

## 2016-01-30 NOTE — Telephone Encounter (Signed)
After reviewing Dr. Garth Schlatter note, I called patient but did not get an answer. I left a message for patient to call if he was still experiencing discomfort. Patient did not have a fever or swelling and was going about his daily activities when I spoke to him this morning.  Riki Sheer, LPN

## 2016-01-30 NOTE — Telephone Encounter (Signed)
Pt aware and will come for xray, order in epic.

## 2016-01-30 NOTE — Telephone Encounter (Signed)
  Follow up Call-  Call back number 01/29/2016  Post procedure Call Back phone  # [phone] cell  Permission to leave phone message Yes     Patient questions:  Do you have a fever, pain , or abdominal swelling? Yes.   Pain Score  5 *  Have you tolerated food without any problems? Yes.    Have you been able to return to your normal activities? Yes.    Do you have any questions about your discharge instructions: Diet   No. Medications  No. Follow up visit  No.  Do you have questions or concerns about your Care? No.  Actions: * If pain score is 4 or above: Physician/ provider Notified : Zenovia Jarred, MD.  Patient reports experiencing lower left side pain and lower left side back pain at a level 8 last night. Today he reports his pain is at a level 5. Patient questions if he should be experiencing this level of discomfort. Patient reports eating without nausea and is going about his normal daily activities.

## 2016-02-02 ENCOUNTER — Telehealth: Payer: Self-pay | Admitting: Internal Medicine

## 2016-02-02 NOTE — Telephone Encounter (Signed)
Pt states he did not come for xray on Friday because he already had a trip planned with his grandson. States the pain got better over the weekend. Pt states what is bothering him now is that he is hardly urinating now. Thinks he only went 2 times over the weekend. Encouraged pt to call his PCP to be seen for decreased urination. Pt verbalized understanding.

## 2016-02-03 ENCOUNTER — Encounter: Payer: Self-pay | Admitting: Internal Medicine

## 2016-05-25 DIAGNOSIS — M1712 Unilateral primary osteoarthritis, left knee: Secondary | ICD-10-CM | POA: Diagnosis not present

## 2016-05-25 DIAGNOSIS — M1711 Unilateral primary osteoarthritis, right knee: Secondary | ICD-10-CM | POA: Diagnosis not present

## 2016-10-19 DIAGNOSIS — M1712 Unilateral primary osteoarthritis, left knee: Secondary | ICD-10-CM | POA: Diagnosis not present

## 2016-10-20 DIAGNOSIS — M1712 Unilateral primary osteoarthritis, left knee: Secondary | ICD-10-CM | POA: Diagnosis not present

## 2016-11-04 DIAGNOSIS — G8918 Other acute postprocedural pain: Secondary | ICD-10-CM | POA: Diagnosis not present

## 2016-11-04 DIAGNOSIS — Z96651 Presence of right artificial knee joint: Secondary | ICD-10-CM | POA: Diagnosis not present

## 2016-11-04 DIAGNOSIS — M1712 Unilateral primary osteoarthritis, left knee: Secondary | ICD-10-CM | POA: Diagnosis not present

## 2016-11-08 DIAGNOSIS — Z9889 Other specified postprocedural states: Secondary | ICD-10-CM | POA: Diagnosis not present

## 2016-11-08 DIAGNOSIS — Z96651 Presence of right artificial knee joint: Secondary | ICD-10-CM | POA: Diagnosis not present

## 2016-11-10 DIAGNOSIS — Z96651 Presence of right artificial knee joint: Secondary | ICD-10-CM | POA: Diagnosis not present

## 2016-11-10 DIAGNOSIS — M25562 Pain in left knee: Secondary | ICD-10-CM | POA: Diagnosis not present

## 2016-11-16 DIAGNOSIS — M25562 Pain in left knee: Secondary | ICD-10-CM | POA: Diagnosis not present

## 2016-11-18 DIAGNOSIS — M25562 Pain in left knee: Secondary | ICD-10-CM | POA: Diagnosis not present

## 2016-11-22 DIAGNOSIS — R7301 Impaired fasting glucose: Secondary | ICD-10-CM | POA: Diagnosis not present

## 2016-11-22 DIAGNOSIS — Z0000 Encounter for general adult medical examination without abnormal findings: Secondary | ICD-10-CM | POA: Diagnosis not present

## 2016-11-22 DIAGNOSIS — Z6841 Body Mass Index (BMI) 40.0 and over, adult: Secondary | ICD-10-CM | POA: Diagnosis not present

## 2016-11-22 DIAGNOSIS — Z125 Encounter for screening for malignant neoplasm of prostate: Secondary | ICD-10-CM | POA: Diagnosis not present

## 2016-11-23 DIAGNOSIS — Z96652 Presence of left artificial knee joint: Secondary | ICD-10-CM | POA: Diagnosis not present

## 2016-11-23 DIAGNOSIS — M25562 Pain in left knee: Secondary | ICD-10-CM | POA: Diagnosis not present

## 2016-11-23 DIAGNOSIS — Z125 Encounter for screening for malignant neoplasm of prostate: Secondary | ICD-10-CM | POA: Diagnosis not present

## 2016-11-26 ENCOUNTER — Encounter (HOSPITAL_BASED_OUTPATIENT_CLINIC_OR_DEPARTMENT_OTHER): Payer: Self-pay

## 2016-11-26 ENCOUNTER — Emergency Department (HOSPITAL_BASED_OUTPATIENT_CLINIC_OR_DEPARTMENT_OTHER)
Admission: EM | Admit: 2016-11-26 | Discharge: 2016-11-26 | Disposition: A | Discharge status: Home / Self Care | Payer: BLUE CROSS/BLUE SHIELD | Attending: Emergency Medicine | Admitting: Emergency Medicine

## 2016-11-26 DIAGNOSIS — R112 Nausea with vomiting, unspecified: Secondary | ICD-10-CM | POA: Insufficient documentation

## 2016-11-26 DIAGNOSIS — I119 Hypertensive heart disease without heart failure: Secondary | ICD-10-CM | POA: Insufficient documentation

## 2016-11-26 DIAGNOSIS — Z87891 Personal history of nicotine dependence: Secondary | ICD-10-CM | POA: Diagnosis not present

## 2016-11-26 DIAGNOSIS — R509 Fever, unspecified: Secondary | ICD-10-CM | POA: Insufficient documentation

## 2016-11-26 DIAGNOSIS — R109 Unspecified abdominal pain: Secondary | ICD-10-CM | POA: Diagnosis not present

## 2016-11-26 DIAGNOSIS — Z79899 Other long term (current) drug therapy: Secondary | ICD-10-CM | POA: Insufficient documentation

## 2016-11-26 DIAGNOSIS — R197 Diarrhea, unspecified: Secondary | ICD-10-CM

## 2016-11-26 LAB — COMPREHENSIVE METABOLIC PANEL
ALK PHOS: 51 U/L (ref 38–126)
ALT: 23 U/L (ref 17–63)
ANION GAP: 6 (ref 5–15)
AST: 26 U/L (ref 15–41)
Albumin: 3.8 g/dL (ref 3.5–5.0)
BILIRUBIN TOTAL: 0.2 mg/dL — AB (ref 0.3–1.2)
BUN: 17 mg/dL (ref 6–20)
CALCIUM: 8.9 mg/dL (ref 8.9–10.3)
CO2: 26 mmol/L (ref 22–32)
Chloride: 106 mmol/L (ref 101–111)
Creatinine, Ser: 0.8 mg/dL (ref 0.61–1.24)
GFR calc non Af Amer: >60 mL/min (ref >60)
GLUCOSE: 111 mg/dL — AB (ref 65–99)
Potassium: 4 mmol/L (ref 3.5–5.1)
Sodium: 138 mmol/L (ref 135–145)
TOTAL PROTEIN: 7 g/dL (ref 6.5–8.1)

## 2016-11-26 LAB — CBC WITH DIFFERENTIAL/PLATELET
Band Neutrophils: 10 %
Basophils Absolute: 0 10*3/uL (ref 0.0–0.1)
Basophils Relative: 0 %
Blasts: 0 %
Eosinophils Absolute: 0.3 10*3/uL (ref 0.0–0.7)
Eosinophils Relative: 4 %
HEMATOCRIT: 37.8 % — AB (ref 39.0–52.0)
HEMOGLOBIN: 12.4 g/dL — AB (ref 13.0–17.0)
Lymphocytes Relative: 28 %
Lymphs Abs: 2 10*3/uL (ref 0.7–4.0)
MCH: 30.2 pg (ref 26.0–34.0)
MCHC: 32.8 g/dL (ref 30.0–36.0)
MCV: 92 fL (ref 78.0–100.0)
MYELOCYTES: 0 %
Metamyelocytes Relative: 0 %
Monocytes Absolute: 0.9 10*3/uL (ref 0.1–1.0)
Monocytes Relative: 12 %
NEUTROS PCT: 46 %
NRBC: 0 /100{WBCs}
Neutro Abs: 4.1 10*3/uL (ref 1.7–7.7)
PROMYELOCYTES ABS: 0 %
Platelets: 288 10*3/uL (ref 150–400)
RBC: 4.11 MIL/uL — AB (ref 4.22–5.81)
RDW: 12.7 % (ref 11.5–15.5)
WBC: 7.3 10*3/uL (ref 4.0–10.5)

## 2016-11-26 LAB — MAGNESIUM: Magnesium: 1.9 mg/dL (ref 1.7–2.4)

## 2016-11-26 MED ORDER — ONDANSETRON HCL 4 MG/2ML IJ SOLN
4.0000 mg | Freq: Once | INTRAMUSCULAR | Status: AC
  Administered 2016-11-26: 4 mg via INTRAVENOUS
  Filled 2016-11-26: qty 2

## 2016-11-26 MED ORDER — SODIUM CHLORIDE 0.9 % IV BOLUS (SEPSIS)
1000.0000 mL | Freq: Once | INTRAVENOUS | Status: AC
Start: 2016-11-26 — End: 2016-11-26
  Administered 2016-11-26: 1000 mL via INTRAVENOUS

## 2016-11-26 NOTE — ED Triage Notes (Signed)
N/v/d x 3 weeks-NAD-steady gait

## 2016-11-26 NOTE — Discharge Instructions (Signed)
Provide a stool sample if able. As you do not have fever or blood in your stool it is probably ok to take imodium.

## 2016-11-26 NOTE — ED Provider Notes (Signed)
[facility] Provider Note   CSN: [id number] Arrival date & time: 11/26/16  1847  By signing my name below, I, Willie Elliott, attest that this documentation has been prepared under the direction and in the presence of physician practitioner, Deno Etienne, DO. Electronically Signed: Dora Elliott, Scribe. 11/26/2016. 6:58 PM.  History   Chief Complaint Chief Complaint  Patient presents with  . Diarrhea    The history is provided by the patient. No language interpreter was used.  Diarrhea   This is a new problem. The current episode started more than 1 week ago. The problem occurs 5 to 10 times per day. The problem has not changed since onset.The stool consistency is described as watery. Maximum temperature: subjective. The fever has been present for 5 days or more (intermittent). Associated symptoms include abdominal pain (cramps) and vomiting. Pertinent negatives include no chills, no headaches, no arthralgias and no myalgias. He has tried an electrolyte solution and increased fluid intake for the symptoms. His past medical history does not include recent abdominal surgery.     HPI Comments: Willie Elliott is a 55 y.o. male who presents to the Emergency Department complaining of constant diarrhea for the last 3 weeks. He reports associated nausea, vomiting, and abdominal cramping. He reports he has had intermittent subjective fevers since onset. He states his abdominal pain presents prior to having bowel movements. He states his diarrhea is worse shortly after eating. No alleviating factors noted. He denies recent travel or unusual food/fluid intake. Pt notes a h/o left knee replacement surgery and states he periodically has had to have this knee drained since the operation. His left knee has been checked for infection several times and he has been on oral antibiotics; he recently finished a course of abx last week but is unsure of its name. No IV antibiotics. He denies ever being  evaluated by an infectious disease physician. No h/o abdominal surgery. He denies melena, hematochezia, hematemesis, numbness, weakness, or any other associated symptoms.  Past Medical History:  Diagnosis Date  . Allergy   . Arthritis   . 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 Hypertension    no meds  . Marijuana abuse   . OBESITY NOS 09/28/2007  . Seasonal allergies   . Syncope 2010  . TOBACCO ABUSE 09/28/2007   has quit-2009    Patient Active Problem List   Diagnosis Date Noted  . OBESITY NOS 09/28/2007  . TOBACCO ABUSE 09/28/2007  . DISEASE, HYPERTENSIVE HEART, BENIGN, W/O HF 09/28/2007    Past Surgical History:  Procedure Laterality Date  . FRACTURE SURGERY     fx rt wrist as child  . KNEE ARTHROCENTESIS Right 2014  . REPLACEMENT TOTAL KNEE    . SHOULDER ARTHROSCOPY WITH ROTATOR CUFF REPAIR AND SUBACROMIAL DECOMPRESSION Right 04/23/2013   Procedure: SHOULDER ARTHROSCOPY WITH ROTATOR CUFF REPAIR AND SUBACROMIAL DECOMPRESSION;  Surgeon: Nita Sells, MD;  Location: [facility];  Service: Orthopedics;  Laterality: Right;  . THUMB ARTHROSCOPY  2012   tendon repair rt thumb       Home Medications    Prior to Admission medications   Medication Sig Start Date End Date Taking? Authorizing Provider  HYDROmorphone (DILAUDID) 2 MG tablet Take by mouth every 4 (four) hours as needed for severe pain.   Yes Historical Provider, MD    Family History Family History  Problem Relation Age of Onset  . Colon cancer Neg Hx   . Esophageal cancer Neg  Hx   . Rectal cancer Neg Hx   . Stomach cancer Neg Hx   . Prostate cancer Neg Hx     Social History Social History  Substance Use Topics  . Smoking status: Former Smoker    Packs/day: 3.00    Years: 45.00    Types: Cigarettes    Quit date: 03/03/2008  . Smokeless tobacco: Never Used  . Alcohol use No     Allergies   Codeine   Review of Systems Review of Systems  Constitutional:  Positive for fever (subjective, intermittent). Negative for chills.  HENT: Negative for congestion and facial swelling.   Eyes: Negative for discharge and visual disturbance.  Respiratory: Negative for shortness of breath.   Cardiovascular: Negative for chest pain and palpitations.  Gastrointestinal: Positive for abdominal pain (cramps), diarrhea, nausea and vomiting. Negative for blood in stool.       Negative for hematemesis. Negative for melena.  Musculoskeletal: Negative for arthralgias and myalgias.  Skin: Negative for color change and rash.  Neurological: Negative for tremors, syncope, weakness, numbness and headaches.  Psychiatric/Behavioral: Negative for confusion and dysphoric mood.     Physical Exam Updated Vital Signs BP 191/97 (BP Location: Left Arm)   Pulse 65   Temp 99.3 F (37.4 C) (Oral)   Resp 18   Ht 5\' 8"  (1.727 m)   Wt 250 lb (113.4 kg)   SpO2 100%   BMI 38.01 kg/m   Physical Exam  Constitutional: He is oriented to person, place, and time. He appears well-developed and well-nourished.  Well-appearing, non-toxic.  HENT:  Head: Normocephalic and atraumatic.  Mouth/Throat: Mucous membranes are normal.  Moist mucous membranes.  Eyes: EOM are normal. Pupils are equal, round, and reactive to light.  Neck: Normal range of motion. Neck supple. No JVD present.  Cardiovascular: Normal rate and regular rhythm.  Exam reveals no gallop and no friction rub.   No murmur heard. Pulmonary/Chest: No respiratory distress. He has no wheezes.  Abdominal: He exhibits no distension. There is no rebound and no guarding.  No noted abdominal tenderness.  Musculoskeletal: Normal range of motion.  Neurological: He is alert and oriented to person, place, and time.  Skin: No rash noted. No pallor.  Psychiatric: He has a normal mood and affect. His behavior is normal.  Nursing note and vitals reviewed.    ED Treatments / Results  Labs (all labs ordered are listed, but only  abnormal results are displayed) Labs Reviewed  CBC WITH DIFFERENTIAL/PLATELET - Abnormal; Notable for the following:       Result Value   RBC 4.11 (*)    Hemoglobin 12.4 (*)    HCT 37.8 (*)    All other components within normal limits  COMPREHENSIVE METABOLIC PANEL - Abnormal; Notable for the following:    Glucose, Bld 111 (*)    Total Bilirubin 0.2 (*)    All other components within normal limits  GASTROINTESTINAL PANEL BY PCR, STOOL (REPLACES STOOL CULTURE)  C DIFFICILE QUICK SCREEN W PCR REFLEX  MAGNESIUM    EKG  EKG Interpretation None       Radiology No results found.  Procedures Procedures (including critical care time)  DIAGNOSTIC STUDIES: Oxygen Saturation is 100% on RA, normal by my interpretation.    COORDINATION OF CARE: 7:09 PM Discussed treatment plan with pt at bedside and pt agreed to plan.  Medications Ordered in ED Medications  sodium chloride 0.9 % bolus 1,000 mL (0 mLs Intravenous Stopped 11/26/16 2048)  ondansetron (  ZOFRAN) injection 4 mg (4 mg Intravenous Given 11/26/16 1940)  well appearing non toxic no noted abdominal tenderness moist mucous membrnes   Initial Impression / Assessment and Plan / ED Course  I have reviewed the triage vital signs and the nursing notes.  Pertinent labs & imaging results that were available during my care of the patient were reviewed by me and considered in my medical decision making (see chart for details).  Clinical Course     55 yo M With a chief complaint nausea vomiting diarrhea. This been going on for at least 3 weeks. Patient has a calculated history with a right-sided knee replacement with wound dehiscence and recurrent effusion. He has been on antibiotics off and on for this. Last taken about a week ago. He is not sure what his antibiotic was. Will check lytes, obtain stool sample if able.   Lytes unremarkable.  Unable to provide stool sample. PCP follow up.   9:08 PM:  I have discussed the  diagnosis/risks/treatment options with the patient and family and believe the pt to be eligible for discharge home to follow-up with PCP. We also discussed returning to the ED immediately if new or worsening sx occur. We discussed the sx which are most concerning (e.g., sudden worsening pain, fever, inability to tolerate by mouth) that necessitate immediate return. Medications administered to the patient during their visit and any new prescriptions provided to the patient are listed below.  Medications given during this visit Medications  sodium chloride 0.9 % bolus 1,000 mL (0 mLs Intravenous Stopped 11/26/16 2048)  ondansetron (ZOFRAN) injection 4 mg (4 mg Intravenous Given 11/26/16 1940)     The patient appears reasonably screen and/or stabilized for discharge and I doubt any other medical condition or other [facility] requiring further screening, evaluation, or treatment in the ED at this time prior to discharge.    Final Clinical Impressions(s) / ED Diagnoses   Final diagnoses:  Diarrhea, unspecified type    New Prescriptions Discharge Medication List as of 11/26/2016  8:29 PM     I personally performed the services described in this documentation, which was scribed in my presence. The recorded information has been reviewed and is accurate.     Deno Etienne, DO 11/26/16 2108

## 2016-11-30 DIAGNOSIS — M25562 Pain in left knee: Secondary | ICD-10-CM | POA: Diagnosis not present

## 2016-11-30 DIAGNOSIS — R197 Diarrhea, unspecified: Secondary | ICD-10-CM | POA: Diagnosis not present

## 2016-12-07 DIAGNOSIS — Z96652 Presence of left artificial knee joint: Secondary | ICD-10-CM | POA: Diagnosis not present

## 2016-12-07 DIAGNOSIS — Z471 Aftercare following joint replacement surgery: Secondary | ICD-10-CM | POA: Diagnosis not present

## 2016-12-07 DIAGNOSIS — M25562 Pain in left knee: Secondary | ICD-10-CM | POA: Diagnosis not present

## 2017-01-25 DIAGNOSIS — Z96652 Presence of left artificial knee joint: Secondary | ICD-10-CM | POA: Diagnosis not present

## 2017-01-25 DIAGNOSIS — Z471 Aftercare following joint replacement surgery: Secondary | ICD-10-CM | POA: Diagnosis not present

## 2017-01-25 DIAGNOSIS — M25562 Pain in left knee: Secondary | ICD-10-CM | POA: Diagnosis not present

## 2017-05-04 DIAGNOSIS — Z09 Encounter for follow-up examination after completed treatment for conditions other than malignant neoplasm: Secondary | ICD-10-CM | POA: Diagnosis not present

## 2017-05-04 DIAGNOSIS — M25562 Pain in left knee: Secondary | ICD-10-CM | POA: Diagnosis not present

## 2017-05-04 DIAGNOSIS — Z96652 Presence of left artificial knee joint: Secondary | ICD-10-CM | POA: Diagnosis not present

## 2017-10-27 DIAGNOSIS — M25562 Pain in left knee: Secondary | ICD-10-CM | POA: Diagnosis not present

## 2017-10-27 DIAGNOSIS — Z96652 Presence of left artificial knee joint: Secondary | ICD-10-CM | POA: Diagnosis not present

## 2017-12-01 DIAGNOSIS — E119 Type 2 diabetes mellitus without complications: Secondary | ICD-10-CM | POA: Diagnosis not present

## 2017-12-01 DIAGNOSIS — Z1389 Encounter for screening for other disorder: Secondary | ICD-10-CM | POA: Diagnosis not present

## 2017-12-01 DIAGNOSIS — Z96652 Presence of left artificial knee joint: Secondary | ICD-10-CM | POA: Diagnosis not present

## 2017-12-01 DIAGNOSIS — Z0000 Encounter for general adult medical examination without abnormal findings: Secondary | ICD-10-CM | POA: Diagnosis not present

## 2017-12-02 DIAGNOSIS — Z1212 Encounter for screening for malignant neoplasm of rectum: Secondary | ICD-10-CM | POA: Diagnosis not present

## 2018-02-20 DIAGNOSIS — M25562 Pain in left knee: Secondary | ICD-10-CM | POA: Diagnosis not present

## 2018-02-20 DIAGNOSIS — Z96652 Presence of left artificial knee joint: Secondary | ICD-10-CM | POA: Diagnosis not present

## 2018-03-03 ENCOUNTER — Other Ambulatory Visit (HOSPITAL_COMMUNITY): Payer: Self-pay | Admitting: Orthopedic Surgery

## 2018-03-03 DIAGNOSIS — M25562 Pain in left knee: Secondary | ICD-10-CM

## 2018-03-03 DIAGNOSIS — T84033A Mechanical loosening of internal left knee prosthetic joint, initial encounter: Secondary | ICD-10-CM

## 2018-03-08 ENCOUNTER — Encounter (HOSPITAL_COMMUNITY): Payer: BLUE CROSS/BLUE SHIELD

## 2018-03-08 ENCOUNTER — Encounter (HOSPITAL_COMMUNITY): Payer: Self-pay

## 2018-03-22 DIAGNOSIS — Z6841 Body Mass Index (BMI) 40.0 and over, adult: Secondary | ICD-10-CM | POA: Diagnosis not present

## 2018-03-22 DIAGNOSIS — Z96652 Presence of left artificial knee joint: Secondary | ICD-10-CM | POA: Diagnosis not present

## 2018-03-22 DIAGNOSIS — E119 Type 2 diabetes mellitus without complications: Secondary | ICD-10-CM | POA: Diagnosis not present

## 2018-08-24 DIAGNOSIS — F639 Impulse disorder, unspecified: Secondary | ICD-10-CM | POA: Diagnosis not present

## 2018-09-01 DIAGNOSIS — F639 Impulse disorder, unspecified: Secondary | ICD-10-CM | POA: Diagnosis not present

## 2018-09-25 DIAGNOSIS — F639 Impulse disorder, unspecified: Secondary | ICD-10-CM | POA: Diagnosis not present

## 2018-09-28 DIAGNOSIS — E669 Obesity, unspecified: Secondary | ICD-10-CM | POA: Diagnosis not present

## 2018-09-28 DIAGNOSIS — E1169 Type 2 diabetes mellitus with other specified complication: Secondary | ICD-10-CM | POA: Diagnosis not present

## 2018-09-28 DIAGNOSIS — Z96652 Presence of left artificial knee joint: Secondary | ICD-10-CM | POA: Diagnosis not present

## 2018-09-28 DIAGNOSIS — I10 Essential (primary) hypertension: Secondary | ICD-10-CM | POA: Diagnosis not present

## 2018-10-02 DIAGNOSIS — F639 Impulse disorder, unspecified: Secondary | ICD-10-CM | POA: Diagnosis not present

## 2019-03-26 DIAGNOSIS — I10 Essential (primary) hypertension: Secondary | ICD-10-CM | POA: Diagnosis not present

## 2019-03-26 DIAGNOSIS — Z96652 Presence of left artificial knee joint: Secondary | ICD-10-CM | POA: Diagnosis not present

## 2019-03-26 DIAGNOSIS — M2550 Pain in unspecified joint: Secondary | ICD-10-CM | POA: Diagnosis not present

## 2019-03-26 DIAGNOSIS — E1169 Type 2 diabetes mellitus with other specified complication: Secondary | ICD-10-CM | POA: Diagnosis not present

## 2019-08-06 DIAGNOSIS — H9201 Otalgia, right ear: Secondary | ICD-10-CM | POA: Diagnosis not present

## 2019-08-06 DIAGNOSIS — H6691 Otitis media, unspecified, right ear: Secondary | ICD-10-CM | POA: Diagnosis not present

## 2022-02-15 ENCOUNTER — Other Ambulatory Visit: Payer: Self-pay

## 2022-02-15 ENCOUNTER — Emergency Department (HOSPITAL_BASED_OUTPATIENT_CLINIC_OR_DEPARTMENT_OTHER)
Admission: EM | Admit: 2022-02-15 | Discharge: 2022-02-15 | Disposition: A | Discharge status: Home / Self Care | Payer: Self-pay | Attending: Emergency Medicine | Admitting: Emergency Medicine

## 2022-02-15 ENCOUNTER — Emergency Department (HOSPITAL_BASED_OUTPATIENT_CLINIC_OR_DEPARTMENT_OTHER): Payer: Self-pay

## 2022-02-15 ENCOUNTER — Emergency Department (HOSPITAL_BASED_OUTPATIENT_CLINIC_OR_DEPARTMENT_OTHER): Payer: Self-pay | Admitting: Radiology

## 2022-02-15 ENCOUNTER — Encounter (HOSPITAL_BASED_OUTPATIENT_CLINIC_OR_DEPARTMENT_OTHER): Payer: Self-pay

## 2022-02-15 DIAGNOSIS — R059 Cough, unspecified: Secondary | ICD-10-CM | POA: Insufficient documentation

## 2022-02-15 DIAGNOSIS — E119 Type 2 diabetes mellitus without complications: Secondary | ICD-10-CM | POA: Insufficient documentation

## 2022-02-15 DIAGNOSIS — R911 Solitary pulmonary nodule: Secondary | ICD-10-CM | POA: Insufficient documentation

## 2022-02-15 DIAGNOSIS — I10 Essential (primary) hypertension: Secondary | ICD-10-CM | POA: Insufficient documentation

## 2022-02-15 DIAGNOSIS — R0602 Shortness of breath: Secondary | ICD-10-CM | POA: Insufficient documentation

## 2022-02-15 DIAGNOSIS — R0600 Dyspnea, unspecified: Secondary | ICD-10-CM | POA: Insufficient documentation

## 2022-02-15 DIAGNOSIS — R072 Precordial pain: Secondary | ICD-10-CM | POA: Insufficient documentation

## 2022-02-15 DIAGNOSIS — R42 Dizziness and giddiness: Secondary | ICD-10-CM | POA: Insufficient documentation

## 2022-02-15 HISTORY — DX: Type 2 diabetes mellitus without complications: E11.9

## 2022-02-15 LAB — BASIC METABOLIC PANEL
Anion gap: 8 (ref 5–15)
BUN: 34 mg/dL — ABNORMAL HIGH (ref 6–20)
CO2: 25 mmol/L (ref 22–32)
Calcium: 9.6 mg/dL (ref 8.9–10.3)
Chloride: 103 mmol/L (ref 98–111)
Creatinine, Ser: 0.95 mg/dL (ref 0.61–1.24)
GFR, Estimated: >60 mL/min (ref >60)
Glucose, Bld: 103 mg/dL — ABNORMAL HIGH (ref 70–99)
Potassium: 4.3 mmol/L (ref 3.5–5.1)
Sodium: 136 mmol/L (ref 135–145)

## 2022-02-15 LAB — TROPONIN I (HIGH SENSITIVITY)
Troponin I (High Sensitivity): 18 ng/L — ABNORMAL HIGH (ref <18)
Troponin I (High Sensitivity): 19 ng/L — ABNORMAL HIGH (ref <18)

## 2022-02-15 LAB — CBC
HCT: 39.7 % (ref 39.0–52.0)
Hemoglobin: 13 g/dL (ref 13.0–17.0)
MCH: 30.9 pg (ref 26.0–34.0)
MCHC: 32.7 g/dL (ref 30.0–36.0)
MCV: 94.3 fL (ref 80.0–100.0)
Platelets: 243 10*3/uL (ref 150–400)
RBC: 4.21 MIL/uL — ABNORMAL LOW (ref 4.22–5.81)
RDW: 12.4 % (ref 11.5–15.5)
WBC: 9.2 10*3/uL (ref 4.0–10.5)
nRBC: 0 % (ref 0.0–0.2)

## 2022-02-15 MED ORDER — IOHEXOL 350 MG/ML SOLN
100.0000 mL | Freq: Once | INTRAVENOUS | Status: AC | PRN
  Administered 2022-02-15: 100 mL via INTRAVENOUS

## 2022-02-15 NOTE — ED Notes (Signed)
ED Provider at bedside. 

## 2022-02-15 NOTE — Discharge Instructions (Addendum)
You came to the emergency department today to be evaluated for your chest pain and lightheadedness.  The CT scan of your chest did not show any blood clots.  It did show a 13 mm pulmonary nodule to your right upper lung.  You will need to have repeat imaging in 3 months to reevaluate this area.  Please contact your primary care provider to schedule this.  Your lab work and EKG were reassuring that you are not having acute heart attack today.  Due to your risk factors I have placed a referral to cardiology.  If you do not hear from them please call the number listed on this paperwork to schedule a follow-up appointment.  Get help right away if: Your chest pain gets worse. You have a cough that gets worse, or you cough up blood. You have severe pain in your abdomen. You faint. You have sudden, unexplained chest discomfort. You have sudden, unexplained discomfort in your arms, back, neck, or jaw. You have shortness of breath at any time. You suddenly start to sweat, or your skin gets clammy. You feel nausea or you vomit. You suddenly feel lightheaded or dizzy. You have severe weakness, or unexplained weakness or fatigue. Your heart begins to beat quickly, or it feels like it is skipping beats.

## 2022-02-15 NOTE — ED Provider Notes (Signed)
[facility] EMERGENCY DEPT Provider Note   CSN: [id number] Arrival date & time: 02/15/22  1634     History  Chief Complaint  Patient presents with   Chest Pain    Willie Elliott is a 61 y.o. male.  With past medical history of hypertension, type 2 diabetes (noncompliant with medication), morbid obesity.  Presents to the emergency department with a chief complaint of chest pain, shortness of breath, and lightheadedness.  Patient reports that he has been having intermittent episodes of chest pain and lightheadedness.  His episode started on Saturday.  Episodes have been persistent since then.  Patient reports that chest pain comes on at random with no aggravating factors.  Pain is located to the left side of his chest and does not radiate.  Patient describes pain as sharp.  Pain lasts for an unspecified amount of time before spontaneously resolving.  Patient does endorse associated shortness of breath with his chest pain.  No nausea, vomiting, or diaphoresis.  Last episode of chest pain occurred 10 minutes prior to my interview with the patient.  Patient has no change in pain or onset of pain with exertion.  Patient reports that episodes of lightheadedness do not occur with his chest pain.  They as well come on at random.  Denies any aggravating factors.  Patient does not endorse any associated palpitations or syncope.  Patient has no change in pain or onset of pain with exertion.  Patient does report that he has had a productive cough for multiple weeks.  Denies any hemoptysis.  Additionally patient does endorse paroxysmal nocturnal dyspnea.  Patient reports that he does not take his metformin medication as he supposed to.  Patient reports that he was just recently started on medication for hypertension.  Patient states that he does smoke cigarettes.   Chest Pain Associated symptoms: no abdominal pain, no back pain, no dizziness, no fever, no headache, no nausea, no numbness, no  palpitations, no shortness of breath, no vomiting and no weakness       Home Medications Prior to Admission medications   Medication Sig Start Date End Date Taking? Authorizing Provider  HYDROmorphone (DILAUDID) 2 MG tablet Take by mouth every 4 (four) hours as needed for severe pain.    [provider]      Allergies    Codeine    Review of Systems   Review of Systems  Constitutional:  Negative for chills and fever.  Eyes:  Negative for visual disturbance.  Respiratory:  Negative for shortness of breath.   Cardiovascular:  Positive for chest pain. Negative for palpitations and leg swelling.  Gastrointestinal:  Negative for abdominal pain, nausea and vomiting.  Genitourinary:  Negative for difficulty urinating and dysuria.  Musculoskeletal:  Negative for back pain and neck pain.  Skin:  Negative for color change and rash.  Neurological:  Positive for light-headedness. Negative for dizziness, tremors, seizures, syncope, facial asymmetry, speech difficulty, weakness, numbness and headaches.  Psychiatric/Behavioral:  Negative for confusion.    Physical Exam Updated Vital Signs BP (!) 185/100    Pulse 61    Temp 98.6 F (37 C)    Resp 17    Ht 5\' 8"  (1.727 m)    Wt 124.7 kg    SpO2 98%    BMI 41.81 kg/m  Physical Exam Vitals and nursing note reviewed.  Constitutional:      General: He is not in acute distress.    Appearance: He is not ill-appearing, toxic-appearing or  diaphoretic.  HENT:     Head: Normocephalic.  Eyes:     General: No scleral icterus.       Right eye: No discharge.        Left eye: No discharge.  Cardiovascular:     Rate and Rhythm: Normal rate.     Pulses:          Radial pulses are 2+ on the right side and 2+ on the left side.     Heart sounds: Murmur heard.     Comments: Systolic murmur Pulmonary:     Effort: Pulmonary effort is normal. No tachypnea, bradypnea or prolonged expiration.     Breath sounds: Normal breath sounds. No stridor.      Comments: Speaks in full clear sentences without difficulty Abdominal:     General: Abdomen is protuberant. There is no distension. There are no signs of injury.     Palpations: Abdomen is soft. There is no mass or pulsatile mass.     Tenderness: There is no guarding or rebound.  Musculoskeletal:     Right lower leg: No edema.     Left lower leg: No edema.  Skin:    General: Skin is warm and dry.  Neurological:     General: No focal deficit present.     Mental Status: He is alert.     GCS: GCS eye subscore is 4. GCS verbal subscore is 5. GCS motor subscore is 6.     Comments: Moves all limbs equally without difficulty  Psychiatric:        Behavior: Behavior is cooperative.    ED Results / Procedures / Treatments   Labs (all labs ordered are listed, but only abnormal results are displayed) Labs Reviewed  BASIC METABOLIC PANEL - Abnormal; Notable for the following components:      Result Value   Glucose, Bld 103 (*)    BUN 34 (*)    All other components within normal limits  CBC - Abnormal; Notable for the following components:   RBC 4.21 (*)    All other components within normal limits  TROPONIN I (HIGH SENSITIVITY) - Abnormal; Notable for the following components:   Troponin I (High Sensitivity) 18 (*)    All other components within normal limits  TROPONIN I (HIGH SENSITIVITY) - Abnormal; Notable for the following components:   Troponin I (High Sensitivity) 19 (*)    All other components within normal limits    EKG EKG Interpretation  Date/Time:  Monday February 15 2022 16:41:49 EST Ventricular Rate:  63 PR Interval:  218 QRS Duration: 94 QT Interval:  404 QTC Calculation: 413 R Axis:   -18 Text Interpretation: Sinus rhythm with 1st degree A-V block Abnormal ECG When compared to prior,  similar appearance. No STEMI Confirmed by Antony Blackbird [phone]) on 02/15/2022 4:51:40 PM  Radiology DG Chest 2 View  Result Date: 02/15/2022 CLINICAL DATA:  Chest pain. EXAM:  CHEST - 2 VIEW COMPARISON:  December 16, 2007. FINDINGS: The heart size and mediastinal contours are within normal limits. Both lungs are clear. The visualized skeletal structures are unremarkable. IMPRESSION: No active cardiopulmonary disease. Electronically Signed   By: Marijo Conception M.D.   On: 02/15/2022 16:58   CT Angio Chest PE W and/or Wo Contrast  Result Date: 02/15/2022 CLINICAL DATA:  Chest pain since Saturday. EXAM: CT ANGIOGRAPHY CHEST WITH CONTRAST TECHNIQUE: Multidetector CT imaging of the chest was performed using the standard protocol during bolus administration of intravenous contrast. Multiplanar  CT image reconstructions and MIPs were obtained to evaluate the vascular anatomy. RADIATION DOSE REDUCTION: This exam was performed according to the departmental dose-optimization program which includes automated exposure control, adjustment of the mA and/or kV according to patient size and/or use of iterative reconstruction technique. CONTRAST:  136mL OMNIPAQUE IOHEXOL 350 MG/ML SOLN COMPARISON:  None. FINDINGS: Cardiovascular: The heart is normal in size. No pericardial effusion. The aorta is normal in caliber. Scattered atherosclerotic calcifications. Two vessel coronary artery calcifications are noted. The pulmonary arterial tree is fairly well opacified. No filling defects to suggest pulmonary embolism. Mediastinum/Nodes: Small scattered mediastinal and hilar lymph nodes but no mass or overt adenopathy. The esophagus is grossly normal. Lungs/Pleura: 13 mm nodular density in the right upper lobe on image 40/6. A small adjacent nodule is noted on the coronal sequence slightly superiorly. This could be inflammatory change. Slightly irregular bronchial wall thickening in the left hilar region suggesting an inflammatory or infectious process. No confluent airspace opacification to suggest typical pneumonia. No pleural effusions or pleural lesions. Upper Abdomen: No significant upper abdominal  findings. Musculoskeletal: No chest wall mass, supraclavicular or axillary adenopathy. The bony thorax is intact. Review of the MIP images confirms the above findings. IMPRESSION: 1. No CT findings for pulmonary embolism. 2. Normal caliber thoracic aorta. 3. Two vessel coronary artery calcifications. 4. 13 mm nodular density in the right upper lobe. A small adjacent nodule is noted. This could be inflammatory change. 5. Slightly irregular bronchial wall thickening in the left hilar region suggesting an inflammatory or infectious process. 6. Recommend follow-up noncontrast chest CT in 3 months to re-evaluate the above findings. If the right upper lobe nodule is persistent a PET-CT may be necessary for further evaluation. 7. Aortic atherosclerosis. Aortic Atherosclerosis (ICD10-I70.0). Electronically Signed   By: Marijo Sanes M.D.   On: 02/15/2022 21:23    Procedures Procedures    Medications Ordered in ED Medications - No data to display  ED Course/ Medical Decision Making/ A&P                           Medical Decision Making Amount and/or Complexity of Data Reviewed Labs: ordered. Radiology: ordered.  Risk Prescription drug management.   Alert 61 year old male in no acute distress, nontoxic-appearing.  Presents emergency department with a chief complaint of chest pain and lightheadedness.  Information was obtained from patient and patient's wife at bedside.  Past medical records were reviewed including previous provider notes and labs.  Patient has past medical history as outlined in HPI which complicates his care.  Patient's chest pain does not sound consistent with ACS however due to he was risk factors will obtain ACS work-up.  Patient played a voicemail from his primary care provider who saw him earlier today.  Primary care provider especially states concern for possible PE.  Will obtain CTA for further evaluation.  EKG was independently viewed and interpreted by myself.  Tracing  shows sinus rhythm with first-degree AV block.  X-ray imaging was viewed and independently interpreted by myself.  I agree with radiology interpretation of no acute cardiopulmonary disease.  Labs were independently interpreted by myself.  Pertinent findings include: -Troponin 18 and 19 with delta of +1 -BUN elevated at 34 with creatinine within normal limits.  Suspect possible dehydration.  Patient reports that he has decreased fluids today.  Patient to lab to have oral fluids while in the emergency department for rehydration.  CTA of chest shows no  findings for PE, normal caliber thoracic aorta, 13 mm nodule density in the right upper lobe.  Patient has heart score of 3.  Slightly elevated troponin with delta of +1.  Low suspicion for ACS at this time.  Patient noted to be hypertensive however hemodynamically stable.  Discussed consultation with cardiology for cardiac observation versus close follow-up in outpatient setting.  Patient elects for close follow-up.  Referral placed to cardiology.  We will have patient follow-up with his PCP.  Patient informed of CT imaging results concerning for nodule and need for repeat imaging in 3 months.  Discussed results, findings, treatment and follow up. Patient advised of return precautions. Patient verbalized understanding and agreed with plan.  Patient care discussed with attending physician Dr. Sherry Ruffing        Final Clinical Impression(s) / ED Diagnoses Final diagnoses:  Nodule of upper lobe of right lung  Precordial chest pain    Rx / DC Orders ED Discharge Orders     None         Loni Beckwith, PA-C 02/16/22 0100    Tegeler, Gwenyth Allegra, MD 02/18/22 1147

## 2022-02-15 NOTE — ED Triage Notes (Signed)
Onset of chest pain with SOB on Saturday.  Comes and goes.  No pain at present. Lungs clear  States also have lower back pain  Left side chest pain.  Worse with deep breath.  Productive cough greenish

## 2022-02-22 ENCOUNTER — Ambulatory Visit (INDEPENDENT_AMBULATORY_CARE_PROVIDER_SITE_OTHER): Payer: Self-pay | Admitting: Cardiology

## 2022-02-22 ENCOUNTER — Encounter: Payer: Self-pay | Admitting: Cardiology

## 2022-02-22 ENCOUNTER — Other Ambulatory Visit: Payer: Self-pay

## 2022-02-22 DIAGNOSIS — R079 Chest pain, unspecified: Secondary | ICD-10-CM | POA: Insufficient documentation

## 2022-02-22 DIAGNOSIS — I119 Hypertensive heart disease without heart failure: Secondary | ICD-10-CM

## 2022-02-22 DIAGNOSIS — I2510 Atherosclerotic heart disease of native coronary artery without angina pectoris: Secondary | ICD-10-CM | POA: Insufficient documentation

## 2022-02-22 DIAGNOSIS — E119 Type 2 diabetes mellitus without complications: Secondary | ICD-10-CM

## 2022-02-22 DIAGNOSIS — F17200 Nicotine dependence, unspecified, uncomplicated: Secondary | ICD-10-CM

## 2022-02-22 MED ORDER — ROSUVASTATIN CALCIUM 10 MG PO TABS: 10.0000 mg | ORAL_TABLET | Freq: Every day | ORAL | 3 refills | Status: DC

## 2022-02-22 MED ORDER — METOPROLOL TARTRATE 50 MG PO TABS: 50.0000 mg | ORAL_TABLET | ORAL | 0 refills | Status: AC

## 2022-02-22 MED ORDER — ASPIRIN EC 81 MG PO TBEC: 81.0000 mg | DELAYED_RELEASE_TABLET | Freq: Every day | ORAL | 3 refills | Status: AC

## 2022-02-22 NOTE — Assessment & Plan Note (Signed)
Continue to encourage diet and exercise.  He is not taking his metformin.  He is seeing his primary care physician here soon.  Prior hemoglobin A1c 7.1.  Prior LDL 124 ALT 21 creatinine 0.95. ?

## 2022-02-22 NOTE — Assessment & Plan Note (Signed)
Personally reviewed and interpreted CT scan from emergency department.  There is LAD calcified plaque noted.  We will proceed with coronary CT scan for further quantification.  Starting Crestor 10 mg.  Would also encourage aspirin 81 mg. ?

## 2022-02-22 NOTE — Progress Notes (Signed)
Cardiology Office Note:    Date:  02/22/2022   ID:  [name], DOB September 23, 1961, MRN [id number]  PCP:  Burnard Bunting, MD   [facility] Providers Cardiologist:  None     Referring MD: Burnard Bunting, MD    History of Present Illness:    Willie Elliott is a 61 y.o. male here for the evaluation of chest pain at the request of Dr. Reynaldo Minium.  He went to the emergency department on 02/15/2022 with intermittent episodes of chest discomfort and lightheadedness.  He has hypertension type 2 diabetes morbid obesity.  Chest pain can come and go at random without any exacerbating factors.  Left side does not radiate.  Can be sharp at times.  No specific shortness of breath.  Does not take his metformin.  Troponins were normal.  EKG shows sinus rhythm first-degree AV block 218 ms.  X-ray independently reviewed and interpreted shows no acute disease.  CT scan of chest performed 02/15/2022 in the emergency department showed no PE.  Did show two-vessel coronary artery calcification personally reviewed and interpreted.  He has had 2 separate thumb injuries, he took a nail out from his thumb himself.  He admits that he does not like to take medications.  He does enjoy spending time with his grandson who is 53 years old.  Recently bought him an Multimedia programmer.  Also has a granddaughter as well.  He wants to be around for his grandkids.  Past Medical History:  Diagnosis Date   Allergy    Arthritis    Diabetes mellitus without complication ([place])    DISEASE, HYPERTENSIVE HEART, BENIGN, W/O HF 09/28/2007   Hypertension    no meds   Marijuana abuse    OBESITY NOS 09/28/2007   Seasonal allergies    Syncope 12/20/2008   TOBACCO ABUSE 09/28/2007   has quit-2009    Past Surgical History:  Procedure Laterality Date   FRACTURE SURGERY     fx rt wrist as child   KNEE ARTHROCENTESIS Right 2014   REPLACEMENT TOTAL KNEE     SHOULDER ARTHROSCOPY WITH ROTATOR CUFF REPAIR AND SUBACROMIAL  DECOMPRESSION Right 04/23/2013   Procedure: SHOULDER ARTHROSCOPY WITH ROTATOR CUFF REPAIR AND SUBACROMIAL DECOMPRESSION;  Surgeon: Nita Sells, MD;  Location: [place];  Service: Orthopedics;  Laterality: Right;   THUMB ARTHROSCOPY  2012   tendon repair rt thumb    Current Medications: Current Meds  Medication Sig   acetaminophen (TYLENOL) 325 MG tablet Take 650 mg by mouth every 6 (six) hours as needed for moderate pain (back and knee paiin).   amLODipine (NORVASC) 10 MG tablet Take 10 mg by mouth daily.   aspirin EC 81 MG tablet Take 1 tablet (81 mg total) by mouth daily. Swallow whole.   ibuprofen (ADVIL) 200 MG tablet Take 200 mg by mouth every 6 (six) hours as needed for moderate pain (back and knee pain).   metoprolol tartrate (LOPRESSOR) 50 MG tablet Take 1 tablet (50 mg total) by mouth as directed. Take 1 tablet 2 hours before your CT scan   Multiple Vitamin (MULTIVITAMIN) capsule Take 1 capsule by mouth daily.   rosuvastatin (CRESTOR) 10 MG tablet Take 1 tablet (10 mg total) by mouth daily.   sildenafil (REVATIO) 20 MG tablet Take 20 mg by mouth 3 (three) times daily. For ED   telmisartan (MICARDIS) 80 MG tablet Take 80 mg by mouth daily.     Allergies:   Codeine   Social  History   Socioeconomic History   Marital status: Married    Spouse name: Not on file   Number of children: Not on file   Years of education: Not on file   Highest education level: Not on file  Occupational History   Not on file  Tobacco Use   Smoking status: Some Days    Packs/day: 3.00    Years: 45.00    Pack years: 135.00    Types: Cigarettes   Smokeless tobacco: Never  Vaping Use   Vaping Use: Not on file  Substance and Sexual Activity   Alcohol use: No    Alcohol/week: 0.0 standard drinks   Drug use: No   Sexual activity: Not on file  Other Topics Concern   Not on file  Social History Narrative   Not on file   Social Determinants of Health   Financial  Resource Strain: Not on file  Food Insecurity: Not on file  Transportation Needs: Not on file  Physical Activity: Not on file  Stress: Not on file  Social Connections: Not on file     Family History: The patient's family history is negative for Colon cancer, Esophageal cancer, Rectal cancer, Stomach cancer, and Prostate cancer.  ROS:   Please see the history of present illness.     All other systems reviewed and are negative.  EKGs/Labs/Other Studies Reviewed:    The following studies were reviewed today: As above  EKG: As above  Recent Labs: 02/15/2022: BUN 34; Creatinine, Ser 0.95; Hemoglobin 13.0; Platelets 243; Potassium 4.3; Sodium 136  Recent Lipid Panel No results found for: CHOL, TRIG, HDL, CHOLHDL, VLDL, LDLCALC, LDLDIRECT   Risk Assessment/Calculations:              Physical Exam:    VS:  BP 132/76 (BP Location: Left Arm, Patient Position: Sitting, Cuff Size: Large)    Pulse 63    Ht '5\' 8"'$  (1.727 m)    Wt 284 lb 3.2 oz (128.9 kg)    SpO2 94%    BMI 43.21 kg/m     Wt Readings from Last 3 Encounters:  02/22/22 284 lb 3.2 oz (128.9 kg)  02/15/22 275 lb (124.7 kg)  11/26/16 250 lb (113.4 kg)     GEN:  Well nourished, well developed in no acute distress HEENT: Normal NECK: No JVD; No carotid bruits LYMPHATICS: No lymphadenopathy CARDIAC: RRR, no murmurs, no rubs, gallops RESPIRATORY:  Clear to auscultation without rales, wheezing or rhonchi  ABDOMEN: Soft, non-tender, non-distended MUSCULOSKELETAL:  No edema; No deformity  SKIN: Warm and dry NEUROLOGIC:  Alert and oriented x 3 PSYCHIATRIC:  Normal affect   ASSESSMENT:    1. Chest pain of uncertain etiology   2. TOBACCO ABUSE   3. Morbid obesity ([place])   4. Coronary artery disease involving native coronary artery of native heart without angina pectoris   5. DISEASE, HYPERTENSIVE HEART, BENIGN, W/O HF   6. Type 2 diabetes mellitus without complication, without long-term current use of insulin (HCC)     PLAN:    In order of problems listed above:  Chest pain of uncertain etiology We will go ahead and check a coronary CT scan with possible FFR analysis.  With his diabetes, hypertension, coronary artery calcification he is at risk for flow-limiting disease. We will go ahead and start Crestor 10 mg once a day for plaque stabilization.  LDL goal less than 70.  He will be seeing his primary care physician soon.  He does admit  that he does not like seeing doctors.  He has not been taking his diabetes medication.  TOBACCO ABUSE Continue to encourage tobacco cessation.  Rarely smokes at this time.  Morbid obesity (HCC) Diet, exercise, decrease caloric intake.  This will help control his diabetes as well.  Discussed today.  Coronary artery disease involving native coronary artery of native heart without angina pectoris Personally reviewed and interpreted CT scan from emergency department.  There is LAD calcified plaque noted.  We will proceed with coronary CT scan for further quantification.  Starting Crestor 10 mg.  Would also encourage aspirin 81 mg.  DISEASE, HYPERTENSIVE HEART, BENIGN, W/O HF Currently doing well with amlodipine 10 and telmisartan 80.  He wonders if the reason he had the chest pain was these medications.  I encouraged him to continue.  Type 2 diabetes mellitus without complication, without long-term current use of insulin (HCC) Continue to encourage diet and exercise.  He is not taking his metformin.  He is seeing his primary care physician here soon.  Prior hemoglobin A1c 7.1.  Prior LDL 124 ALT 21 creatinine 0.95.         Medication Adjustments/Labs and Tests Ordered: Current medicines are reviewed at length with the patient today.  Concerns regarding medicines are outlined above.  Orders Placed This Encounter  Procedures   CT CORONARY MORPH W/CTA COR W/SCORE W/CA W/CM &/OR WO/CM   Meds ordered this encounter  Medications   metoprolol tartrate (LOPRESSOR) 50 MG  tablet    Sig: Take 1 tablet (50 mg total) by mouth as directed. Take 1 tablet 2 hours before your CT scan    Dispense:  1 tablet    Refill:  0   rosuvastatin (CRESTOR) 10 MG tablet    Sig: Take 1 tablet (10 mg total) by mouth daily.    Dispense:  90 tablet    Refill:  3   aspirin EC 81 MG tablet    Sig: Take 1 tablet (81 mg total) by mouth daily. Swallow whole.    Dispense:  90 tablet    Refill:  3    Patient Instructions  Medication Instructions:  Please start Crestor 10 mg a day. Start Aspirin 81 mg a day. Continue all other medications as listed.  *If you need a refill on your cardiac medications before your next appointment, please call your pharmacy*   Lab Work: None needed today If you have labs (blood work) drawn today and your tests are completely normal, you will receive your results only by: [place] (if you have MyChart) OR A paper copy in the mail If you have any lab test that is abnormal or we need to change your treatment, we will call you to review the results.   Testing/Procedures:   Your cardiac CT will be scheduled at:   [facility] [address] [place], [place] [address] [phone]  Please arrive at the [facility] and Children's Entrance (Entrance C2) of [facility] 30 minutes prior to test start time. You can use the FREE valet parking offered at entrance C (encouraged to control the heart rate for the test)  Proceed to the [facility] Radiology Department (first floor) to check-in and test prep.  All radiology patients and guests should use entrance C2 at [facility], accessed from [facility], even though the hospital's physical address listed is [address]     Please follow these instructions carefully (unless otherwise directed):  Hold all  erectile dysfunction medications at least 3 days (72 hrs) prior to test.  On the Night Before the Test: Be sure to Drink plenty of  water. Do not consume any caffeinated/decaffeinated beverages or chocolate 12 hours prior to your test. Do not take any antihistamines 12 hours prior to your test.  On the Day of the Test: Drink plenty of water until 1 hour prior to the test. Do not eat any food 4 hours prior to the test. You may take your regular medications prior to the test.  Take metoprolol (Lopressor) two hours prior to test. HOLD Furosemide/Hydrochlorothiazide morning of the test.  After the Test: Drink plenty of water. After receiving IV contrast, you may experience a mild flushed feeling. This is normal. On occasion, you may experience a mild rash up to 24 hours after the test. This is not dangerous. If this occurs, you can take Benadryl 25 mg and increase your fluid intake. If you experience trouble breathing, this can be serious. If it is severe call 911 IMMEDIATELY. If it is mild, please call our office. If you take any of these medications: Glipizide/Metformin, Avandament, Glucavance, please do not take 48 hours after completing test unless otherwise instructed.  We will call to schedule your test 2-4 weeks out understanding that some insurance companies will need an authorization prior to the service being performed.   For non-scheduling related questions, please contact the cardiac imaging nurse navigator should you have any questions/concerns: Marchia Bond, Cardiac Imaging Nurse Navigator Gordy Clement, Cardiac Imaging Nurse Navigator [facility] Direct Office Dial: [phone]   For scheduling needs, including cancellations and rescheduling, please call [place], [phone].   Follow-Up: At [facility], you and your health needs are our priority.  As part of our continuing mission to provide you with exceptional heart care, we have created designated Provider Care Teams.  These Care Teams include your primary Cardiologist (physician) and Advanced Practice Providers (APPs  -  Physician Assistants and Nurse Practitioners) who all work together to provide you with the care you need, when you need it.  We recommend signing up for the patient portal called "MyChart".  Sign up information is provided on this After Visit Summary.  MyChart is used to connect with patients for Virtual Visits (Telemedicine).  Patients are able to view lab/test results, encounter notes, upcoming appointments, etc.  Non-urgent messages can be sent to your provider as well.   To learn more about what you can do with MyChart, go to [email or web address].    Your next appointment:   Follow up will be determined after the above testing has been completed.  Thank you for choosing [facility]!!      Signed, Candee Furbish, MD  02/22/2022 12:13 PM    [facility]

## 2022-02-22 NOTE — Assessment & Plan Note (Addendum)
Continue to encourage tobacco cessation.  Rarely smokes at this time. ?

## 2022-02-22 NOTE — Assessment & Plan Note (Signed)
We will go ahead and check a coronary CT scan with possible FFR analysis.  With his diabetes, hypertension, coronary artery calcification he is at risk for flow-limiting disease. ?We will go ahead and start Crestor 10 mg once a day for plaque stabilization.  LDL goal less than 70.  He will be seeing his primary care physician soon.  He does admit that he does not like seeing doctors.  He has not been taking his diabetes medication. ?

## 2022-02-22 NOTE — Patient Instructions (Addendum)
Medication Instructions:  ?Please start Crestor 10 mg a day. ?Start Aspirin 81 mg a day. ?Continue all other medications as listed. ? ?*If you need a refill on your cardiac medications before your next appointment, please call your pharmacy* ? ? ?Lab Work: ?None needed today ?If you have labs (blood work) drawn today and your tests are completely normal, you will receive your results only by: ?MyChart Message (if you have MyChart) OR ?A paper copy in the mail ?If you have any lab test that is abnormal or we need to change your treatment, we will call you to review the results. ? ? ?Testing/Procedures: ? ? ?Your cardiac CT will be scheduled at:  ? ?[facility] ?[address] ?[place], [place] [address] ?[phone] ? ?Please arrive at the [facility] and Children's Entrance (Entrance C2) of [facility] 30 minutes prior to test start time. ?You can use the FREE valet parking offered at entrance C (encouraged to control the heart rate for the test)  ?Proceed to the [facility] Radiology Department (first floor) to check-in and test prep. ? ?All radiology patients and guests should use entrance C2 at [facility], accessed from [facility], even though the hospital's physical address listed is [address]. ? ? ? ? ?Please follow these instructions carefully (unless otherwise directed): ? ?Hold all erectile dysfunction medications at least 3 days (72 hrs) prior to test. ? ?On the Night Before the Test: ?Be sure to Drink plenty of water. ?Do not consume any caffeinated/decaffeinated beverages or chocolate 12 hours prior to your test. ?Do not take any antihistamines 12 hours prior to your test. ? ?On the Day of the Test: ?Drink plenty of water until 1 hour prior to the test. ?Do not eat any food 4 hours prior to the test. ?You may take your regular medications prior to the test.  ?Take metoprolol (Lopressor) two hours prior to test. ?HOLD Furosemide/Hydrochlorothiazide  morning of the test. ? ?After the Test: ?Drink plenty of water. ?After receiving IV contrast, you may experience a mild flushed feeling. This is normal. ?On occasion, you may experience a mild rash up to 24 hours after the test. This is not dangerous. If this occurs, you can take Benadryl 25 mg and increase your fluid intake. ?If you experience trouble breathing, this can be serious. If it is severe call 911 IMMEDIATELY. If it is mild, please call our office. ?If you take any of these medications: Glipizide/Metformin, Avandament, Glucavance, please do not take 48 hours after completing test unless otherwise instructed. ? ?We will call to schedule your test 2-4 weeks out understanding that some insurance companies will need an authorization prior to the service being performed.  ? ?For non-scheduling related questions, please contact the cardiac imaging nurse navigator should you have any questions/concerns: ?Marchia Bond, Cardiac Imaging Nurse Navigator ?Gordy Clement, Cardiac Imaging Nurse Navigator ?[facility] ?Direct Office Dial: [phone]  ? ?For scheduling needs, including cancellations and rescheduling, please call [place], [phone]. ? ? ?Follow-Up: ?At [facility], you and your health needs are our priority.  As part of our continuing mission to provide you with exceptional heart care, we have created designated Provider Care Teams.  These Care Teams include your primary Cardiologist (physician) and Advanced Practice Providers (APPs -  Physician Assistants and Nurse Practitioners) who all work together to provide you with the care you need, when you need it. ? ?We recommend signing up for the patient  portal called "MyChart".  Sign up information is provided on this After Visit Summary.  MyChart is used to connect with patients for Virtual Visits (Telemedicine).  Patients are able to view lab/test results, encounter notes, upcoming appointments, etc.  Non-urgent messages  can be sent to your provider as well.   ?To learn more about what you can do with MyChart, go to [email or web address].   ? ?Your next appointment:   ?Follow up will be determined after the above testing has been completed. ? ?Thank you for choosing [facility]!! ? ? ? ?

## 2022-02-22 NOTE — Assessment & Plan Note (Signed)
Diet, exercise, decrease caloric intake.  This will help control his diabetes as well.  Discussed today. ?

## 2022-02-22 NOTE — Assessment & Plan Note (Signed)
Currently doing well with amlodipine 10 and telmisartan 80.  He wonders if the reason he had the chest pain was these medications.  I encouraged him to continue. ?

## 2022-03-08 ENCOUNTER — Telehealth (HOSPITAL_COMMUNITY): Payer: Self-pay | Admitting: Emergency Medicine

## 2022-03-08 ENCOUNTER — Telehealth (HOSPITAL_COMMUNITY): Payer: Self-pay | Admitting: *Deleted

## 2022-03-08 NOTE — Telephone Encounter (Signed)
Attempted to call patient regarding upcoming cardiac CT appointment. °Left message on voicemail with name and callback number ° °Fern Canova RN Navigator Cardiac Imaging °[facility] °[phone] Office °[phone] Cell ° °

## 2022-03-08 NOTE — Telephone Encounter (Signed)
Reaching out to patient to offer assistance regarding upcoming cardiac imaging study; pt verbalizes understanding of appt date/time, parking situation and where to check in, pre-test NPO status and medications ordered, and verified current allergies; name and call back number provided for further questions should they arise ?Marchia Bond RN Navigator Cardiac Imaging ?[facility] ?[phone] office ?[phone] cell ? ?Denies iv issues ?50 metoprolol ?Arrival 400 ?

## 2022-03-09 ENCOUNTER — Other Ambulatory Visit: Payer: Self-pay

## 2022-03-09 ENCOUNTER — Ambulatory Visit (HOSPITAL_COMMUNITY)
Admission: RE | Admit: 2022-03-09 | Discharge: 2022-03-09 | Disposition: A | Discharge status: Home / Self Care | Payer: 59 | Source: Ambulatory Visit | Attending: Cardiology | Admitting: Cardiology

## 2022-03-09 DIAGNOSIS — R079 Chest pain, unspecified: Secondary | ICD-10-CM | POA: Insufficient documentation

## 2022-03-09 DIAGNOSIS — I2510 Atherosclerotic heart disease of native coronary artery without angina pectoris: Secondary | ICD-10-CM | POA: Insufficient documentation

## 2022-03-09 DIAGNOSIS — F17200 Nicotine dependence, unspecified, uncomplicated: Secondary | ICD-10-CM | POA: Insufficient documentation

## 2022-03-09 MED ORDER — IOHEXOL 350 MG/ML SOLN
95.0000 mL | Freq: Once | INTRAVENOUS | Status: AC | PRN
  Administered 2022-03-09: 95 mL via INTRAVENOUS

## 2022-03-09 MED ORDER — NITROGLYCERIN 0.4 MG SL SUBL
SUBLINGUAL_TABLET | SUBLINGUAL | Status: AC
  Filled 2022-03-09: qty 2

## 2022-03-09 MED ORDER — NITROGLYCERIN 0.4 MG SL SUBL
0.8000 mg | SUBLINGUAL_TABLET | Freq: Once | SUBLINGUAL | Status: AC
  Administered 2022-03-09: 0.8 mg via SUBLINGUAL

## 2022-03-24 ENCOUNTER — Encounter: Payer: Self-pay | Admitting: *Deleted

## 2022-03-30 ENCOUNTER — Telehealth: Payer: Self-pay | Admitting: Cardiology

## 2022-03-30 MED ORDER — ROSUVASTATIN CALCIUM 20 MG PO TABS: 20.0000 mg | ORAL_TABLET | Freq: Every day | ORAL | 3 refills | Status: AC

## 2022-03-30 NOTE — Telephone Encounter (Signed)
Can not tell when amlodipine or telmisartan were started since they both say historical medications.  Were these increased doses?  If so, may need to reduce amlodipine dose ?

## 2022-03-30 NOTE — Telephone Encounter (Signed)
Patient calling for CT results - gave an updated phone number, he states this number is only temporary so did not add to his chart.  ?

## 2022-03-30 NOTE — Telephone Encounter (Signed)
Returned call to Pt. ? ?Advised of CT results and medication changes. ? ?New prescription for Crestor sent to Pt's pharmacy. ? ?Pt states that he feels like he has no energy now, and this has started since he started amlodopine and telmisartan. ? ?He states his BP's have been good, but he upset by his lack of energy. ? ?Advised would send to pharmacy and Dr. Marlou Porch for further advisement. ?

## 2022-03-31 NOTE — Telephone Encounter (Signed)
Would suggest he discuss further with his PCP. Thanks  ?Candee Furbish, MD  ? ? ?Left detailed message on mobile VM (DPR) of the above information. ?

## 2022-04-28 ENCOUNTER — Ambulatory Visit (INDEPENDENT_AMBULATORY_CARE_PROVIDER_SITE_OTHER): Payer: 59 | Admitting: Orthopaedic Surgery

## 2022-04-28 DIAGNOSIS — M1611 Unilateral primary osteoarthritis, right hip: Secondary | ICD-10-CM

## 2022-04-28 NOTE — Progress Notes (Signed)
? ?Office Visit Note ?  ?Patient: Willie Elliott [facility]           ?Date of Birth: [birth year]           ?MRN: [id number] ?Visit Date: 04/28/2022 ?             ?Requested by: Burnard Bunting, MD ?[address] ?[place],  [place] [address] ?PCP: Burnard Bunting, MD ? ? ?Assessment & Plan: ?Visit Diagnoses:  ?1. Unilateral primary osteoarthritis, right hip   ? ? ?Plan: Impression is right hip advanced degenerative joint disease.  Today we discussed various treatment options to include intra-articular cortisone injection versus total hip arthroplasty.  He would like to proceed with injection at this time.  Referral is made to Dr. Ernestina Patches for this.  Follow-up with [place] as needed. ? ?Follow-Up Instructions: Return if symptoms worsen or fail to improve.  ? ?Orders:  ?No orders of the defined types were placed in this encounter. ? ?No orders of the defined types were placed in this encounter. ? ? ? ? Procedures: ?No procedures performed ? ? ?Clinical Data: ?No additional findings. ? ? ?Subjective: ?Chief Complaint  ?Patient presents with  ? Right Hip - Pain  ? ? ?HPI patient is a 61 year old gentleman who comes in today with right hip pain for the past 2-1/2 months.  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  He notes that the symptoms started shortly after starting 2 different blood pressure medications.  He also notes that he started his statin medication 2 weeks after that.  The pain he has is all to the groin.  He notes occasional pain to the anterior thigh.  Pain is worse when sitting and leaning to the right as well as with walking.  He denies any paresthesias to the right lower extremity.  He had what sounds like an IM steroid injection last week which is helped. ? ?Review of Systems as detailed in HPI.  All others reviewed and are negative. ? ? ?Objective: ?Vital Signs: There were no vitals taken for this visit. ? ?Physical Exam well-developed well-nourished gentleman in no acute distress.  Alert and  oriented x3. ? ?Ortho Exam right hip exam reveals a positive logroll with very little rotation.  No pain with hip flexion.  No pain with Stinchfield.  Negative straight leg raise.  No focal weakness.  He is neurovascular intact distally. ? ?Specialty Comments:  ?No specialty comments available. ? ?Imaging: ?X-rays from outside disc reveal advanced degenerative changes to the right hip ? ? ?PMFS History: ?Patient Active Problem List  ? Diagnosis Date Noted  ? Chest pain of uncertain etiology 65/99/3570  ? Coronary artery disease involving native coronary artery of native heart without angina pectoris 02/22/2022  ? Type 2 diabetes mellitus without complication, without long-term current use of insulin ([place]) 02/22/2022  ? Morbid obesity ([place]) 09/28/2007  ? TOBACCO ABUSE 09/28/2007  ? DISEASE, HYPERTENSIVE HEART, BENIGN, W/O HF 09/28/2007  ? ?Past Medical History:  ?Diagnosis Date  ? Allergy   ? Arthritis   ? Diabetes mellitus without complication ([place])   ? DISEASE, HYPERTENSIVE HEART, BENIGN, W/O HF 09/28/2007  ? Hypertension   ? no meds  ? Marijuana abuse   ? OBESITY NOS 09/28/2007  ? Seasonal allergies   ? Syncope 12/20/2008  ? TOBACCO ABUSE 09/28/2007  ? has quit-2009  ?  ?Family History  ?Problem Relation Age of Onset  ? Colon cancer Neg Hx   ? Esophageal cancer  Neg Hx   ? Rectal cancer Neg Hx   ? Stomach cancer Neg Hx   ? Prostate cancer Neg Hx   ?  ?Past Surgical History:  ?Procedure Laterality Date  ? FRACTURE SURGERY    ? fx rt wrist as child  ? KNEE ARTHROCENTESIS Right 2014  ? REPLACEMENT TOTAL KNEE    ? SHOULDER ARTHROSCOPY WITH ROTATOR CUFF REPAIR AND SUBACROMIAL DECOMPRESSION Right 04/23/2013  ? Procedure: SHOULDER ARTHROSCOPY WITH ROTATOR CUFF REPAIR AND SUBACROMIAL DECOMPRESSION;  Surgeon: Nita Sells, MD;  Location: [facility];  Service: Orthopedics;  Laterality: Right;  ? THUMB ARTHROSCOPY  2012  ? tendon repair rt thumb  ? ?Social History  ? ?Occupational History  ? Not on  file  ?Tobacco Use  ? Smoking status: Some Days  ?  Packs/day: 3.00  ?  Years: 45.00  ?  Pack years: 135.00  ?  Types: Cigarettes  ? Smokeless tobacco: Never  ?Vaping Use  ? Vaping Use: Not on file  ?Substance and Sexual Activity  ? Alcohol use: No  ?  Alcohol/week: 0.0 standard drinks  ? Drug use: No  ? Sexual activity: Not on file  ? ? ? ? ? ? ?

## 2022-05-04 ENCOUNTER — Telehealth: Payer: Self-pay | Admitting: Physical Medicine and Rehabilitation

## 2022-05-04 NOTE — Telephone Encounter (Signed)
Patient called needing tom schedule an appointment with Dr. Ernestina Patches. The number to contact patient is [phone]  ?

## 2022-05-05 NOTE — Telephone Encounter (Signed)
Patient called in to schedule his Cortisone injection he would prefer a later afternoon appt. Please advise ?

## 2022-05-06 ENCOUNTER — Telehealth: Payer: Self-pay | Admitting: Physical Medicine and Rehabilitation

## 2022-05-06 NOTE — Telephone Encounter (Signed)
Called pt to get sch with [facility] and pt hung up on me twice when I asked if he was Daiel and told him I was trying to sch with [place]. If this pt calls you can send to me and make pt aware I tried calling multiple times.

## 2022-06-10 ENCOUNTER — Ambulatory Visit (INDEPENDENT_AMBULATORY_CARE_PROVIDER_SITE_OTHER): Payer: 59 | Admitting: Physical Medicine and Rehabilitation

## 2022-06-10 ENCOUNTER — Encounter: Payer: Self-pay | Admitting: Physical Medicine and Rehabilitation

## 2022-06-10 ENCOUNTER — Ambulatory Visit: Payer: Self-pay

## 2022-06-10 DIAGNOSIS — M25551 Pain in right hip: Secondary | ICD-10-CM

## 2022-06-10 NOTE — Progress Notes (Signed)
Pt state right hip pain. Pt state any movement makes the pain worse. Pt state he takes over the counter pain meds to help ease his pain.  Numeric Pain Rating Scale and Functional Assessment Average Pain 6   In the last MONTH (on 0-10 scale) has pain interfered with the following?  1. General activity like being  able to carry out your everyday physical activities such as walking, climbing stairs, carrying groceries, or moving a chair?  Rating(9)   -BT, -Dye Allergies.

## 2022-06-10 NOTE — Progress Notes (Signed)
   CHALMERS IDDINGS - 61 y.o. male MRN [id number]  Date of birth: [birth year]  Office Visit Note: Visit Date: 06/10/2022 PCP: Burnard Bunting, MD Referred by: Burnard Bunting, MD  Subjective: Chief Complaint  Patient presents with   Right Hip - Pain   HPI:  Willie Elliott is a 61 y.o. male who comes in today at the request of Dr. Eduard Roux for planned Right anesthetic hip arthrogram with fluoroscopic guidance.  The patient has failed conservative care including home exercise, medications, time and activity modification.  This injection will be diagnostic and hopefully therapeutic.  Please see requesting physician notes for further details and justification.   ROS Otherwise per HPI.  Assessment & Plan: Visit Diagnoses:    ICD-10-CM   1. Pain in right hip  M25.551 Large Joint Inj: R hip joint    XR C-ARM NO REPORT      Plan: No additional findings.   Meds & Orders: No orders of the defined types were placed in this encounter.   Orders Placed This Encounter  Procedures   Large Joint Inj: R hip joint   XR C-ARM NO REPORT    Follow-up: Return for visit to requesting provider as needed.   Procedures: Large Joint Inj: R hip joint on 06/10/2022 2:09 PM Indications: diagnostic evaluation and pain Details: 22 G 3.5 in needle, fluoroscopy-guided anterior approach  Arthrogram: No  Medications: 4 mL bupivacaine 0.25 %; 60 mg triamcinolone acetonide 40 MG/ML Outcome: tolerated well, no immediate complications  There was excellent flow of contrast producing a partial arthrogram of the hip. The patient did have relief of symptoms during the anesthetic phase of the injection. Procedure, treatment alternatives, risks and benefits explained, specific risks discussed. Consent was given by the patient. Immediately prior to procedure a time out was called to verify the correct patient, procedure, equipment, support staff and site/side marked as required. Patient was prepped and draped in the usual  sterile fashion.          Clinical History: No specialty comments available.     Objective:  VS:  HT:    WT:   BMI:     BP:   HR: bpm  TEMP: ( )  RESP:  Physical Exam   Imaging: No results found.

## 2022-07-04 MED ORDER — TRIAMCINOLONE ACETONIDE 40 MG/ML IJ SUSP
60.0000 mg | INTRAMUSCULAR | Status: AC | PRN
  Administered 2022-06-10: 60 mg via INTRA_ARTICULAR

## 2022-07-04 MED ORDER — BUPIVACAINE HCL 0.25 % IJ SOLN
4.0000 mL | INTRAMUSCULAR | Status: AC | PRN
  Administered 2022-06-10: 4 mL via INTRA_ARTICULAR

## 2022-08-09 ENCOUNTER — Telehealth: Payer: Self-pay | Admitting: Physical Medicine and Rehabilitation

## 2022-08-09 NOTE — Telephone Encounter (Signed)
Patient called. Would like to know if he could see Dr. Ernestina Patches again. Starting to have pain.

## 2022-08-09 NOTE — Telephone Encounter (Signed)
Pt called and states he wants to repeat right hip injection. Last injection was 06/22. Last injection helped 90% pain relief   Cb [phone]

## 2022-08-25 ENCOUNTER — Ambulatory Visit: Payer: Self-pay

## 2022-08-25 ENCOUNTER — Ambulatory Visit (INDEPENDENT_AMBULATORY_CARE_PROVIDER_SITE_OTHER): Payer: Self-pay | Admitting: Physical Medicine and Rehabilitation

## 2022-08-25 ENCOUNTER — Encounter: Payer: Self-pay | Admitting: Physical Medicine and Rehabilitation

## 2022-08-25 DIAGNOSIS — M25551 Pain in right hip: Secondary | ICD-10-CM

## 2022-08-25 MED ORDER — TRIAMCINOLONE ACETONIDE 40 MG/ML IJ SUSP
40.0000 mg | INTRAMUSCULAR | Status: AC | PRN
  Administered 2022-08-25: 40 mg via INTRA_ARTICULAR

## 2022-08-25 MED ORDER — BUPIVACAINE HCL 0.25 % IJ SOLN
4.0000 mL | INTRAMUSCULAR | Status: AC | PRN
  Administered 2022-08-25: 4 mL via INTRA_ARTICULAR

## 2022-08-25 NOTE — Progress Notes (Signed)
   Willie Elliott - 61 y.o. male MRN [id number]  Date of birth: Dec 07, 1961  Office Visit Note: Visit Date: 08/25/2022 PCP: Burnard Bunting, MD Referred by: Burnard Bunting, MD  Subjective: Chief Complaint  Patient presents with   Right Hip - Pain   HPI:  Willie Elliott is a 61 y.o. male who comes in today for planned repeat Right anesthetic hip arthrogram with fluoroscopic guidance.  The patient has failed conservative care including home exercise, medications, time and activity modification. Prior injection gave more than 50% relief for several months. This injection will be diagnostic and hopefully therapeutic.  Please see requesting physician notes for further details and justification.  Referring: Dr. Eduard Roux  While diagnostically he did get good relief with the injection it really was not as much as he had hoped for.  It also only seem to last a couple of months.  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  I have asked him to make follow-up with Dr. Eduard Roux or his assistant Mendel Ryder who the patient saw in the past.  He would be a good candidate for replacement.   ROS Otherwise per HPI.  Assessment & Plan: Visit Diagnoses:    ICD-10-CM   1. Pain in right hip  M25.551 XR C-ARM NO REPORT      Plan: No additional findings.   Meds & Orders: No orders of the defined types were placed in this encounter.   Orders Placed This Encounter  Procedures   Large Joint Inj   XR C-ARM NO REPORT    Follow-up: No follow-ups on file.   Procedures: Large Joint Inj: R hip joint on 08/25/2022 3:45 PM Indications: diagnostic evaluation and pain Details: 22 G 3.5 in needle, fluoroscopy-guided anterior approach  Arthrogram: No  Medications: 4 mL bupivacaine 0.25 %; 40 mg triamcinolone acetonide 40 MG/ML Outcome: tolerated well, no immediate complications  There was excellent flow of contrast producing a partial arthrogram of  the hip. The patient did have relief of symptoms during the anesthetic phase of the injection. Procedure, treatment alternatives, risks and benefits explained, specific risks discussed. Consent was given by the patient. Immediately prior to procedure a time out was called to verify the correct patient, procedure, equipment, support staff and site/side marked as required. Patient was prepped and draped in the usual sterile fashion.          Clinical History: No specialty comments available.     Objective:  VS:  HT:    WT:   BMI:     BP:   HR: bpm  TEMP: ( )  RESP:  Physical Exam   Imaging: XR C-ARM NO REPORT  Result Date: 08/25/2022 Please see Notes tab for imaging impression.

## 2022-08-25 NOTE — Progress Notes (Signed)
Pt state right hip pain. Pt state any movement makes the pain worse. Pt state he takes over the counter pain meds to help ease his pain.  Numeric Pain Rating Scale and Functional Assessment Average Pain 9   In the last MONTH (on 0-10 scale) has pain interfered with the following?  1. General activity like being  able to carry out your everyday physical activities such as walking, climbing stairs, carrying groceries, or moving a chair?  Rating(10)   +Driver, -BT, -Dye Allergies.

## 2022-08-30 ENCOUNTER — Telehealth: Payer: Self-pay | Admitting: Physical Medicine and Rehabilitation

## 2022-08-30 NOTE — Telephone Encounter (Signed)
Pt called and states that his right hip injection did not work. He thinks that it may have made the pain worse. He wants to know what he is suppose to do?

## 2022-09-07 ENCOUNTER — Encounter: Payer: Self-pay | Admitting: Orthopaedic Surgery

## 2022-09-07 ENCOUNTER — Ambulatory Visit: Payer: Commercial Managed Care - HMO | Admitting: Orthopaedic Surgery

## 2022-09-07 VITALS — Ht 67.0 in | Wt 278.0 lb

## 2022-09-07 DIAGNOSIS — Z6841 Body Mass Index (BMI) 40.0 and over, adult: Secondary | ICD-10-CM | POA: Insufficient documentation

## 2022-09-07 DIAGNOSIS — M1611 Unilateral primary osteoarthritis, right hip: Secondary | ICD-10-CM | POA: Insufficient documentation

## 2022-09-07 MED ORDER — TRAMADOL HCL 50 MG PO TABS: 50.0000 mg | ORAL_TABLET | Freq: Every day | ORAL | 0 refills | Status: DC | PRN

## 2022-09-07 NOTE — Progress Notes (Signed)
Office Visit Note   Patient: Willie Elliott           Date of Birth: 1961-09-20           MRN: [id number] Visit Date: 09/07/2022              Requested by: Burnard Bunting, MD [address] [place],  [place] [address] PCP: Burnard Bunting, MD   Assessment & Plan: Visit Diagnoses:  1. Primary osteoarthritis of right hip   2. Body mass index 40.0-44.9, adult (HCC)     Plan: Impression is advanced end-stage right hip DJD.  2 cortisone injections this year have only provided temporary relief with the most recent 1 providing basically no relief.  Treatment options were explained again and currently patient is not a surgical candidate until his BMI can be less than 40.  Goal weight is 250 pounds.  He was also counseled on tobacco cessation and good diabetic control.  He will follow-up with [place] when he has reached his goal weight.  Tramadol prescribed for breakthrough pain.  He can also consider using a cane.  The patient meets the AMA guidelines for Morbid (severe) obesity with a BMI > 40.0 and I have recommended weight loss.  Follow-Up Instructions: No follow-ups on file.   Orders:  No orders of the defined types were placed in this encounter.  No orders of the defined types were placed in this encounter.     Procedures: No procedures performed   Clinical Data: No additional findings.   Subjective: Chief Complaint  Patient presents with   Right Hip - Pain    HPI [name] returns today for right hip pain due to DJD.  He underwent an intra-articular hip injection on 08/25/2022 and he feels like the pain has already returned.  He has had 2 cortisone injections this year.  Review of Systems   Objective: Vital Signs: Ht '5\' 7"'$  (1.702 m)   Wt 278 lb (126.1 kg)   BMI 43.54 kg/m   Physical Exam  Ortho Exam Examination of the right hip is unchanged. Specialty Comments:  No specialty comments available.  Imaging: No results found.   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 09/28/2007   Past Medical History:  Diagnosis Date   Allergy    Arthritis    Diabetes mellitus without complication ([place])    DISEASE, HYPERTENSIVE HEART, BENIGN, 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 09/28/2007   Hypertension    no meds   Marijuana abuse    OBESITY NOS 09/28/2007   Seasonal allergies    Syncope 12/20/2008   TOBACCO ABUSE 09/28/2007   has quit-2009    Family History  Problem Relation Age of Onset   Colon cancer Neg Hx    Esophageal cancer Neg Hx    Rectal cancer Neg Hx    Stomach cancer Neg Hx    Prostate cancer Neg Hx     Past Surgical History:  Procedure Laterality Date   FRACTURE SURGERY     fx rt wrist as child  KNEE ARTHROCENTESIS Right 2014   REPLACEMENT TOTAL KNEE     SHOULDER ARTHROSCOPY WITH ROTATOR CUFF REPAIR AND SUBACROMIAL DECOMPRESSION Right 04/23/2013   Procedure: SHOULDER ARTHROSCOPY WITH ROTATOR CUFF REPAIR AND SUBACROMIAL DECOMPRESSION;  Surgeon: Nita Sells, MD;  Location: [facility];  Service: Orthopedics;  Laterality: Right;   THUMB ARTHROSCOPY  2012   tendon repair rt thumb   Social History   Occupational History   Not on file  Tobacco Use   Smoking status: Some Days    Packs/day: 3.00    Years: 45.00    Total pack years: 135.00    Types: Cigarettes   Smokeless tobacco: Never  Vaping Use   Vaping Use: Not on file  Substance and Sexual Activity   Alcohol use: No    Alcohol/week: 0.0 standard drinks of alcohol   Drug use: No    Sexual activity: Not on file

## 2022-09-14 ENCOUNTER — Ambulatory Visit: Payer: 59 | Admitting: Orthopaedic Surgery

## 2022-09-21 ENCOUNTER — Other Ambulatory Visit: Payer: Self-pay | Admitting: Orthopaedic Surgery

## 2022-09-24 ENCOUNTER — Telehealth: Payer: Self-pay | Admitting: Orthopaedic Surgery

## 2022-09-24 NOTE — Telephone Encounter (Signed)
Patient called. He would like a refill on Tramadol. His call back number is [phone]

## 2022-09-24 NOTE — Telephone Encounter (Signed)
Please advise 

## 2022-09-25 MED ORDER — TRAMADOL HCL 50 MG PO TABS: 50.0000 mg | ORAL_TABLET | Freq: Every day | ORAL | 0 refills | Status: DC | PRN

## 2022-09-25 NOTE — Telephone Encounter (Signed)
Done

## 2022-09-27 ENCOUNTER — Telehealth: Payer: Self-pay | Admitting: Orthopaedic Surgery

## 2022-09-27 ENCOUNTER — Other Ambulatory Visit: Payer: Self-pay | Admitting: Physician Assistant

## 2022-09-27 ENCOUNTER — Telehealth: Payer: Self-pay | Admitting: Physician Assistant

## 2022-09-27 MED ORDER — TRAMADOL HCL 50 MG PO TABS: 50.0000 mg | ORAL_TABLET | Freq: Every day | ORAL | 0 refills | Status: AC | PRN

## 2022-09-27 MED ORDER — TRAMADOL HCL 50 MG PO TABS: 50.0000 mg | ORAL_TABLET | Freq: Every day | ORAL | 0 refills | Status: DC | PRN

## 2022-09-27 NOTE — Telephone Encounter (Signed)
Pt called requesting tramadol been sent to [facility] on [address]. Please call pt when sent to correct pharmacy. Pt phone number is [phone].

## 2022-09-27 NOTE — Telephone Encounter (Signed)
Patient called. He would like a refill on Tramadol. His call back number is [phone]. Send to [facility] pharmacy on [address]

## 2022-09-27 NOTE — Telephone Encounter (Signed)
resent

## 2022-09-27 NOTE — Telephone Encounter (Signed)
sent 

## 2022-10-04 ENCOUNTER — Telehealth: Payer: Self-pay | Admitting: Orthopaedic Surgery

## 2022-10-04 NOTE — Telephone Encounter (Signed)
Spoke with patient and relayed information. He is experiencing buttock pain in the opposite side as his his pain. He will give our office a call if he wants to look into this pain.

## 2022-10-04 NOTE — Telephone Encounter (Signed)
Patient called in stating he has lost 10 LBS so he can prepare for his Hip surgery but his left butt cheek is starting to hurt and he wants to know if it has something to do with his hip please advise

## 2022-10-04 NOTE — Telephone Encounter (Signed)
Hip pain is usually referred to the groin/anterior thigh.  Sounds like he could be aggravating some surrounding muscles (piriformis) or possibly his back

## 2023-01-27 ENCOUNTER — Ambulatory Visit: Payer: Commercial Managed Care - HMO

## 2023-01-27 ENCOUNTER — Ambulatory Visit: Payer: BLUE CROSS/BLUE SHIELD

## 2023-01-28 ENCOUNTER — Ambulatory Visit: Payer: Commercial Managed Care - HMO

## 2023-10-29 IMAGING — CT CT ANGIO CHEST
2 of 7 series · 17 of 46 positions shown · IV contrast (agent unspecified)
Comparison: None.

CLINICAL DATA: Chest pain since [REDACTED].

EXAM:
CT ANGIOGRAPHY CHEST WITH CONTRAST
TECHNIQUE: Multidetector CT imaging of the chest was performed using the
standard protocol during bolus administration of intravenous
contrast. Multiplanar CT image reconstructions and MIPs were
obtained to evaluate the vascular anatomy.

[Series 5: pe axial thins · axial · 0.89mm/px · z∈[-242,+20]mm · 14 of 304 slices shown]
[im 21/304  lung]
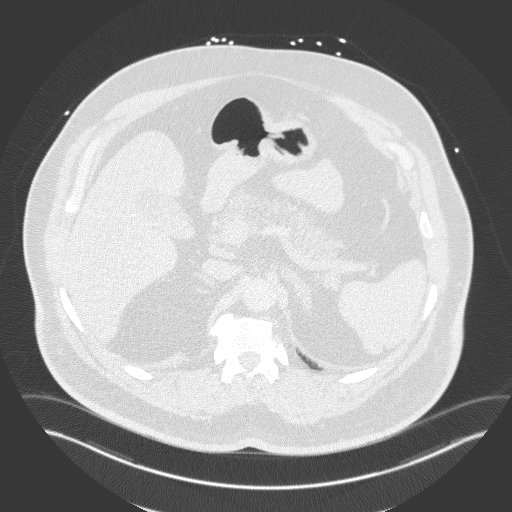
[im 41/304  soft-tissue]
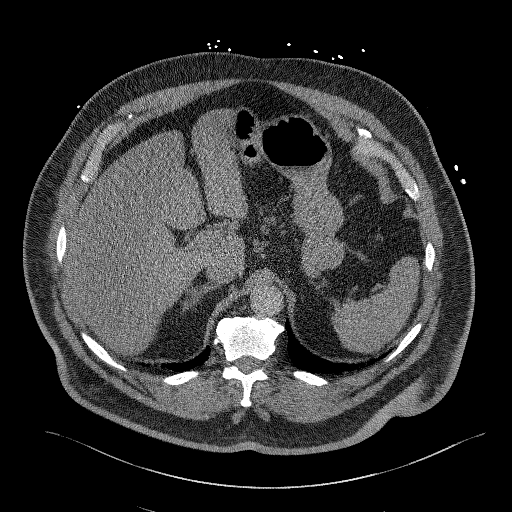
[im 61/304  lung]
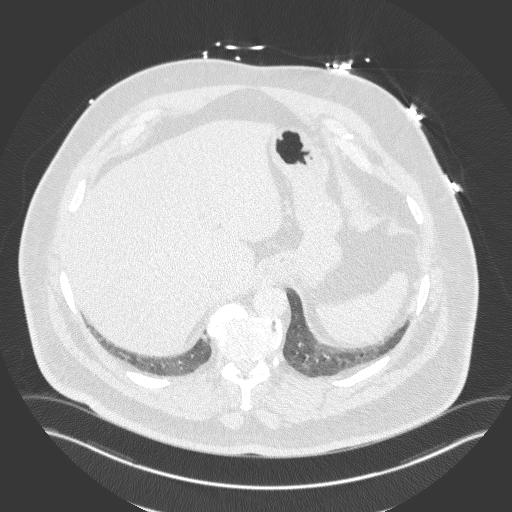
[im 81/304  soft-tissue]
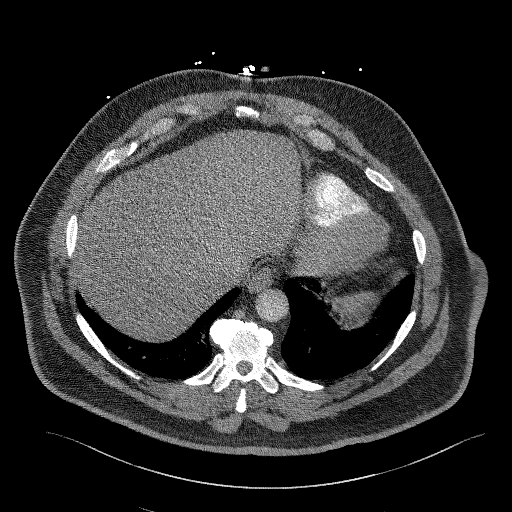
[im 102/304  lung]
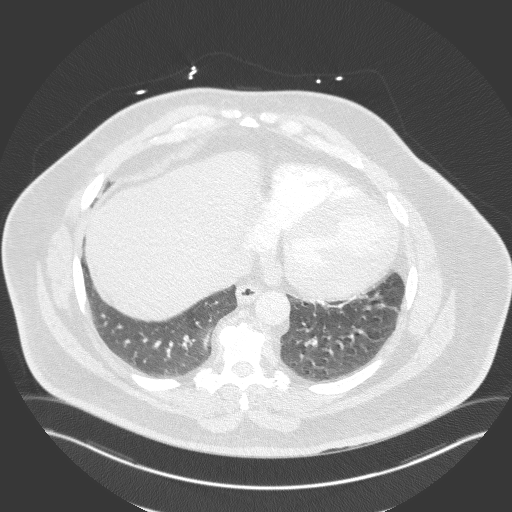
[im 122/304  soft-tissue]
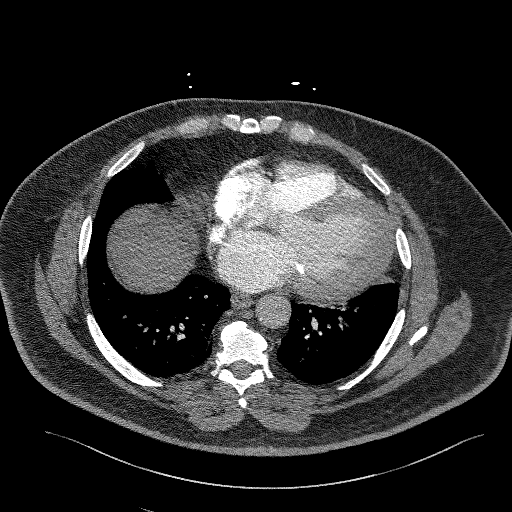
[im 142/304  lung]
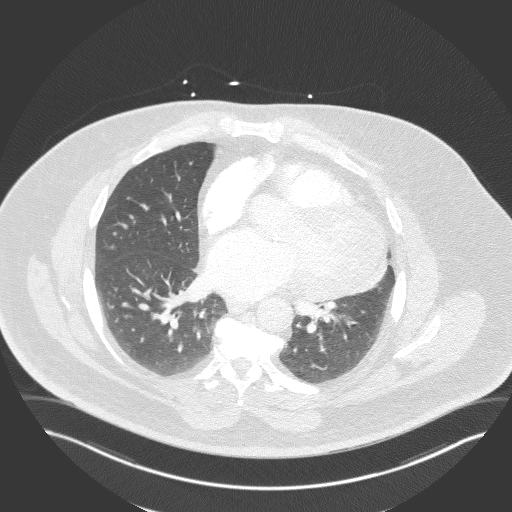
[im 162/304  soft-tissue]
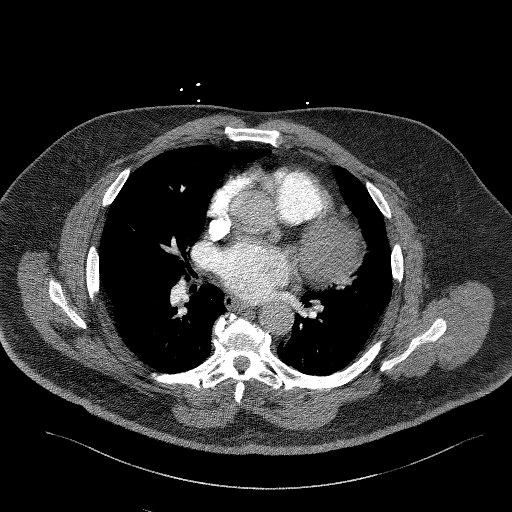
[im 182/304  lung]
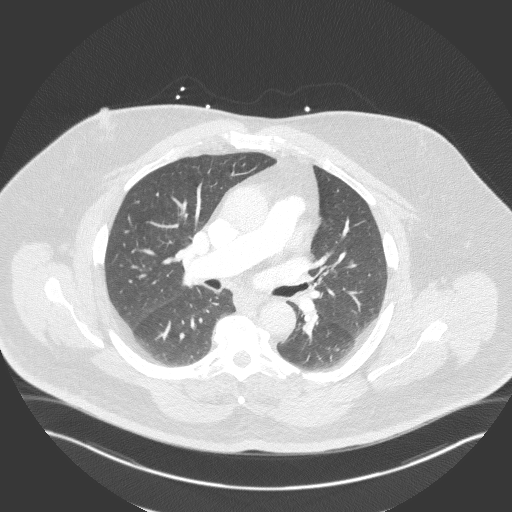
[im 203/304  soft-tissue]
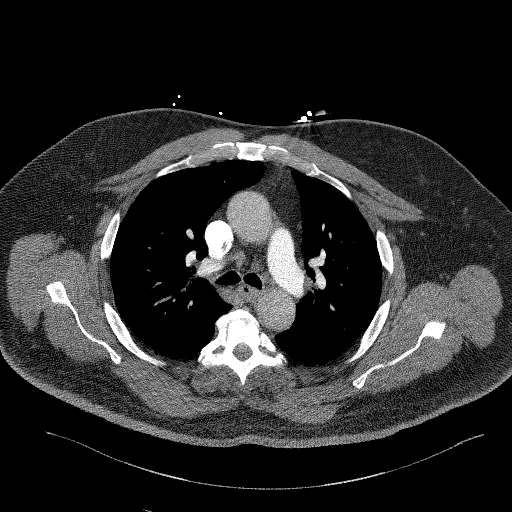
[im 223/304  lung]
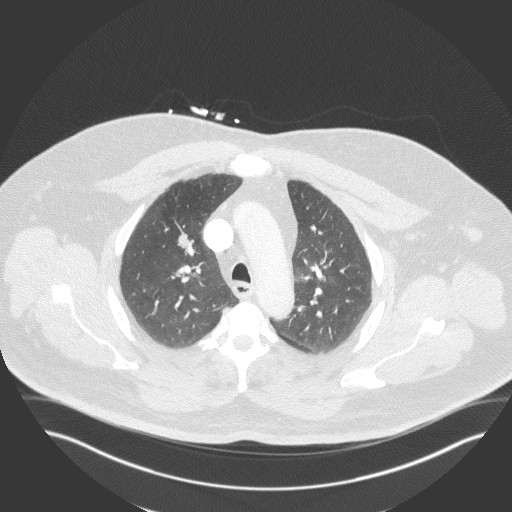
[im 243/304  soft-tissue]
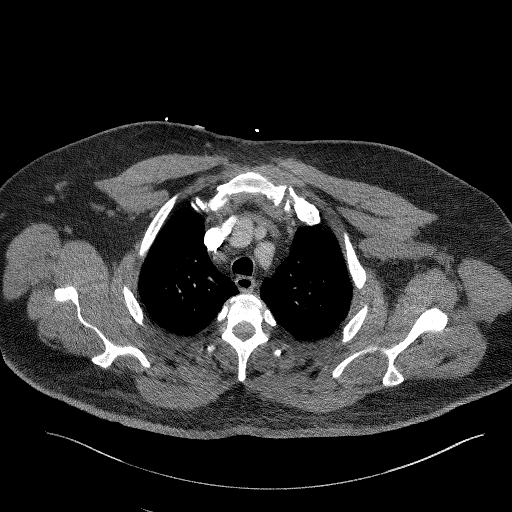
[im 263/304  lung]
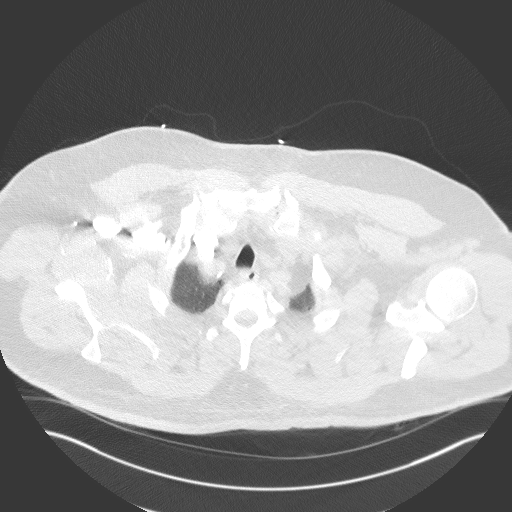
[im 283/304  soft-tissue]
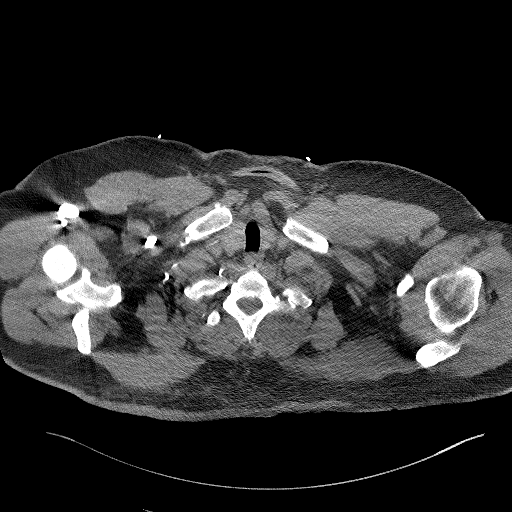

[Series 7: cor soft · coronal · 0.59mm/px · 3 of 188 slices shown]
[im 47/188  soft-tissue]
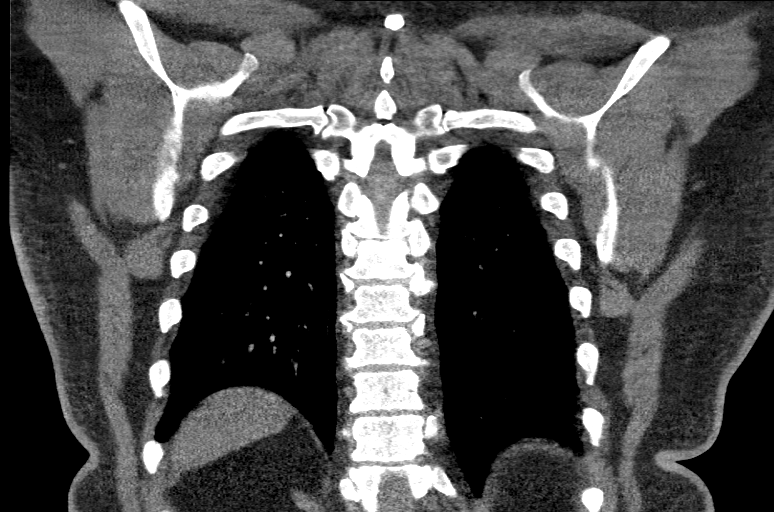
[im 94/188  soft-tissue]
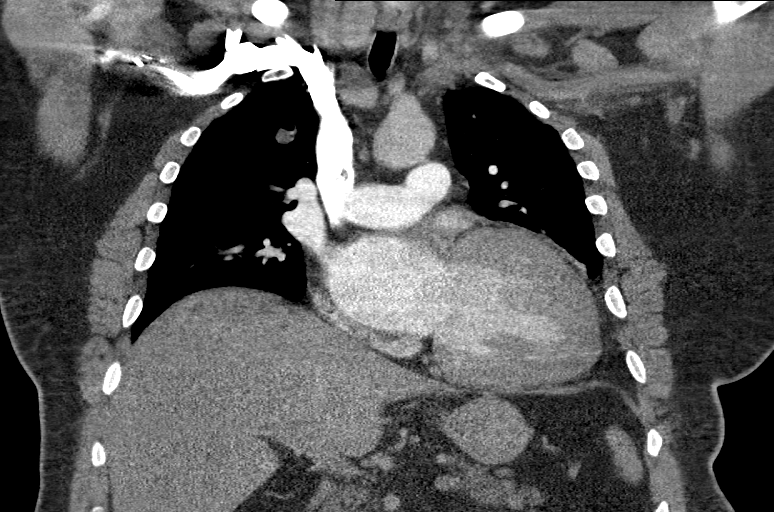
[im 141/188  soft-tissue]
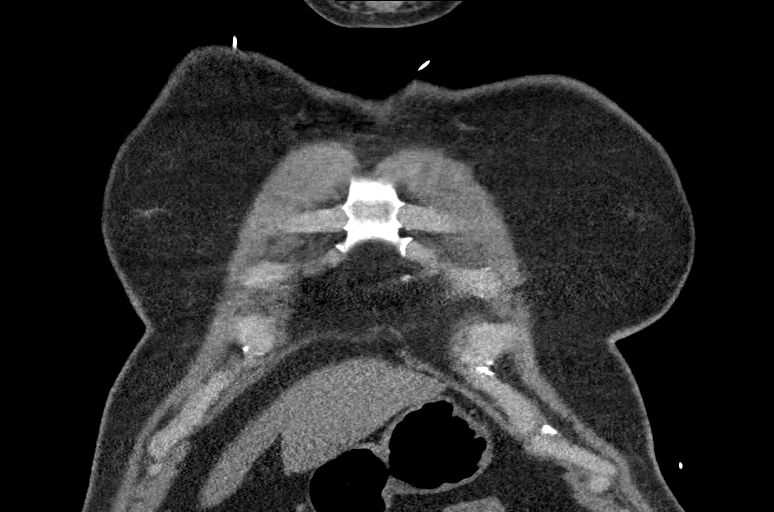

[17 of 46 positions shown; findings below may reference images not displayed]

RADIATION DOSE REDUCTION: This exam was performed according to the
departmental dose-optimization program which includes automated
exposure control, adjustment of the mA and/or kV according to
patient size and/or use of iterative reconstruction technique.

CONTRAST:  100mL OMNIPAQUE IOHEXOL 350 MG/ML SOLN
FINDINGS: Cardiovascular: The heart is normal in size. No pericardial
effusion. The aorta is normal in caliber. Scattered atherosclerotic
calcifications. Two vessel coronary artery calcifications are noted.
The pulmonary arterial tree is fairly well opacified. No filling
defects to suggest pulmonary embolism.

Mediastinum/Nodes: Small scattered mediastinal and hilar lymph nodes
but no mass or overt adenopathy. The esophagus is grossly normal.

Lungs/Pleura: 13 mm nodular density in the right upper lobe on image
40/6. A small adjacent nodule is noted on the coronal sequence
slightly superiorly. This could be inflammatory change. Slightly
irregular bronchial wall thickening in the left hilar region
suggesting an inflammatory or infectious process. No confluent
airspace opacification to suggest typical pneumonia. No pleural
effusions or pleural lesions.

Upper Abdomen: No significant upper abdominal findings.

Musculoskeletal: No chest wall mass, supraclavicular or axillary
adenopathy. The bony thorax is intact.

Review of the MIP images confirms the above findings.
IMPRESSION: 1. No CT findings for pulmonary embolism.
2. Normal caliber thoracic aorta.
3. Two vessel coronary artery calcifications.
4. 13 mm nodular density in the right upper lobe. A small adjacent
nodule is noted. This could be inflammatory change.
5. Slightly irregular bronchial wall thickening in the left hilar
region suggesting an inflammatory or infectious process.
6. Recommend follow-up noncontrast chest CT in 3 months to
re-evaluate the above findings. If the right upper lobe nodule is
persistent a PET-CT may be necessary for further evaluation.
7. Aortic atherosclerosis.

Aortic Atherosclerosis ([id number]).

## 2023-11-20 IMAGING — CT CT HEART MORP W/ CTA COR W/ SCORE W/ CA W/CM &/OR W/O CM
1 of 11 series · 2 of 20 positions shown, 3 images · IV contrast (APPLIED)
Comparison: 02/15/2022

Addendum:
CLINICAL DATA: Chest Pain

EXAM:
Cardiac CTA
Coronary CTA
MEDICATIONS:
Sub lingual nitro. 4mg and lopressor 50mg
TECHNIQUE: The patient was scanned on a Siemens Force [REDACTED]ice scanner. Gantry
rotation speed was 250 msecs. Collimation was .6 mm. A 100 kV
prospective scan was triggered in the ascending thoracic aorta at
140 HU's Full mA was used between 35% and 75% of the R-R interval.
Average HR during the scan was 61 bpm. The 3D data set was
interpreted on a dedicated work station using MPR, MIP and VRT
modes. A total of 80 cc of contrast was used.
The patient was scanned on a Siemens Force scanner.

[Series 11: best syst · axial · 0.41mm/px · z∈[-378,-240]mm · 2 of 348 slices shown, 3 images]
[im 1/348  vessel]
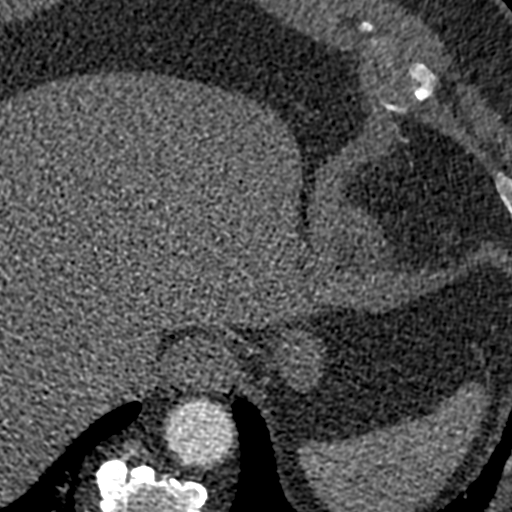
[im 1/348  lung]
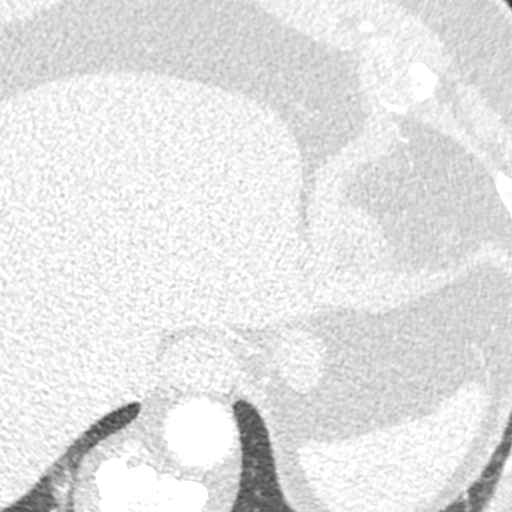
[im 348/348  vessel]
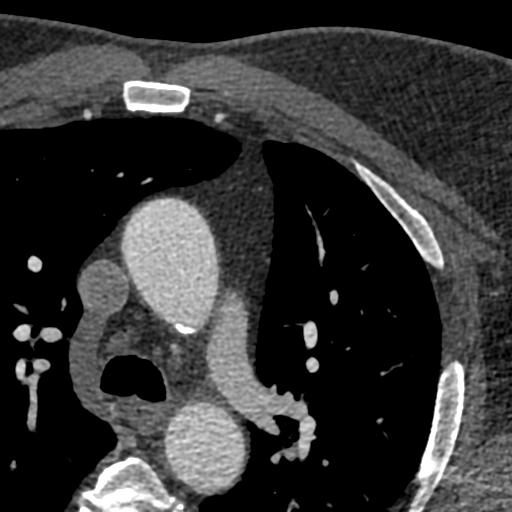

[2 of 20 positions shown; findings below may reference images not displayed]

FINDINGS: Non-cardiac: See separate report from [REDACTED]. No
significant findings on limited lung and soft tissue windows.

Calcium Score:  Calcium noted in LAD and LCX

LM: 0

RCA: 0

LAD: 243

LCX:

Total Score: 298 which is 84 th percentile for age/sex

Coronary Arteries: Left  dominant with no anomalies

LM: Normal

LAD: 1-24% proximal and mid calcific plaque

D1: Normal

D2: 1-24% calcified plaque

Circumflex: 1-24% calcific plaque in mid vessel 1-24% calcific
plaque in distal vessel

OM1: Normal

OM2: Normal

RCA: Small non dominant normal
IMPRESSION: 1. Calcium score 298 which is 84 th percentile for age/sex

2.  Mildly dilated ascending thoracic aorta 3.8 cm

3. CAD RADS 1 non obstructive CAD in Left Dominant coronary arteries
See description above

4.  Mitral annular calcification present

5.  Tri leaflet AV with some calcium noted

Jose Erasmo Eloisa

EXAM:
OVER-READ INTERPRETATION  CT CHEST

The following report is an over-read performed by radiologist Dr.
does not include interpretation of cardiac or coronary anatomy or
pathology. The coronary CTA interpretation by the cardiologist is
attached.
FINDINGS: Normal caliber of the visualized thoracic aorta. Visualized
mediastinal structures are normal. Images of the upper abdomen are
unremarkable. Mild pleural thickening along the posterior chest
bilaterally and appears stable. Otherwise, the visualized lungs are
clear. No pleural effusions. Bridging osteophytes in thoracic spine.
No acute bone abnormality.
IMPRESSION: No acute extracardiac findings.

*** End of Addendum ***
FINDINGS: Non-cardiac: See separate report from [REDACTED]. No
significant findings on limited lung and soft tissue windows.

Calcium Score:  Calcium noted in LAD and LCX

LM: 0

RCA: 0

LAD: 243

LCX:

Total Score: 298 which is 84 th percentile for age/sex

Coronary Arteries: Left  dominant with no anomalies

LM: Normal

LAD: 1-24% proximal and mid calcific plaque

D1: Normal

D2: 1-24% calcified plaque

Circumflex: 1-24% calcific plaque in mid vessel 1-24% calcific
plaque in distal vessel

OM1: Normal

OM2: Normal

RCA: Small non dominant normal
IMPRESSION: 1. Calcium score 298 which is 84 th percentile for age/sex

2.  Mildly dilated ascending thoracic aorta 3.8 cm

3. CAD RADS 1 non obstructive CAD in Left Dominant coronary arteries
See description above

4.  Mitral annular calcification present

5.  Tri leaflet AV with some calcium noted

Jose Erasmo Eloisa

## 2024-04-20 ENCOUNTER — Encounter: Payer: Self-pay | Admitting: Physician Assistant

## 2024-06-07 NOTE — Progress Notes (Signed)
 Willie Canard, PA-C [address] [place], [place]  [address] Phone: [phone]   Gastroenterology Consultation  Referring Provider:     Suan Elm, MD Primary Care Physician:  Willie Elm, MD Primary Gastroenterologist:  Willie Canard, PA-C / Dr. Laurell Elliott  Reason for Consultation:     Discuss repeat colonoscopy        HPI:   Willie Elliott is a 63 y.o. y/o male referred for consultation & management  by Willie Elm, MD.    01/2016 last colonoscopy by Dr. Bridgett Elliott: 3 small hyperplastic polyps (3 mm to 5 mm) removed from sigmoid colon and rectum.  Mild diverticulosis.  Good prep.  10-year repeat colonoscopy was recommended (due 01/2026).  Current symptoms: Patient denies any GI symptoms such as abdominal pain, heartburn, dysphagia, rectal bleeding, constipation, or weight loss.  He has rare episode of diarrhea, depending on dietary indiscretion.  He denies family history of colon cancer.  His brother was recently diagnosed with stomach cancer at age 12.  Patient denies any upper or lower GI symptoms.  Patient had recent complete physical with Dr. Reita Elliott at [facility].  03/19/2024 labs showed normal hemoglobin 12.8, MCV 96, platelet 274.  Normal TSH.  Normal CMP except elevated glucose 171.  PMH: Diabetes, obesity, hypertension, history of right hip replacement.  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 09/28/2007   Hypertension    no meds   Marijuana abuse    OBESITY NOS 09/28/2007   Seasonal allergies    Syncope 12/20/2008   TOBACCO ABUSE 09/28/2007   has quit-2009    Past Surgical History:  Procedure Laterality Date   FRACTURE SURGERY     fx rt wrist as child   KNEE ARTHROCENTESIS Right 2014   REPLACEMENT TOTAL KNEE     SHOULDER ARTHROSCOPY WITH ROTATOR CUFF REPAIR AND SUBACROMIAL DECOMPRESSION Right 04/23/2013   Procedure:  SHOULDER ARTHROSCOPY WITH ROTATOR CUFF REPAIR AND SUBACROMIAL DECOMPRESSION;  Surgeon: Willie Flattery, MD;  Location: [facility];  Service: Orthopedics;  Laterality: Right;   THUMB ARTHROSCOPY  2012   tendon repair rt thumb    Prior to Admission medications   Medication Sig Start Date End Date Taking? Authorizing Provider  acetaminophen  (TYLENOL ) 325 MG tablet Take 650 mg by mouth every 6 (six) hours as needed for moderate pain (back and knee paiin).    [provider]  amLODipine (NORVASC) 10 MG tablet Take 10 mg by mouth daily. 02/04/22   [provider]  aspirin  EC 81 MG tablet Take 1 tablet (81 mg total) by mouth daily. Swallow whole. 02/22/22   Willie Madura, MD  ibuprofen (ADVIL) 200 MG tablet Take 200 mg by mouth every 6 (six) hours as needed for moderate pain (back and knee pain).    [provider]  metoprolol  tartrate (LOPRESSOR ) 50 MG tablet Take 1 tablet (50 mg total) by mouth as directed. Take 1 tablet 2 hours before your CT scan 02/22/22   Willie Madura, MD  Multiple Vitamin (MULTIVITAMIN) capsule Take 1 capsule by mouth daily.    [provider]  rosuvastatin  (CRESTOR ) 20 MG tablet Take 1 tablet (20 mg total) by mouth daily. 03/30/22 06/28/22  Willie Madura, MD  sildenafil (REVATIO) 20 MG tablet Take 20 mg by mouth 3 (three) times daily. For ED  [provider]  telmisartan (MICARDIS) 80 MG tablet Take 80 mg by mouth daily.    [provider]  traMADol  (ULTRAM ) 50 MG tablet Take 1 tablet (50 mg total) by mouth daily as needed. 09/27/22   Willie Cross, PA-C    Family History  Problem Relation Age of Onset   Stomach cancer Brother    Colon cancer Neg Hx    Esophageal cancer Neg Hx    Rectal cancer Neg Hx    Prostate cancer Neg Hx      Social History   Tobacco Use   Smoking status: Some Days    Current packs/day: 3.00    Average packs/day: 3.0 packs/day for 45.0 years (135.0 ttl pk-yrs)     Types: Cigarettes   Smokeless tobacco: Never  Substance Use Topics   Alcohol use: Yes    Comment: OCC   Drug use: No    Allergies as of 06/08/2024 - Review Complete 06/08/2024  Allergen Reaction Noted   Codeine Itching 04/19/2013    Review of Systems:    All systems reviewed and negative except where noted in HPI.   Physical Exam:  BP (!) 140/78   Pulse 68   Ht 5' 8 (1.727 m)   Wt 282 lb (127.9 kg)   SpO2 94%   BMI 42.88 kg/m  No LMP for male patient.  General:   Alert,  Well-developed, obese pleasant and cooperative in NAD Lungs:  Respirations even and unlabored.  Clear throughout to auscultation.   No wheezes, crackles, or rhonchi. No acute distress. Heart:  Regular rate and rhythm; no murmurs, clicks, rubs, or gallops. Abdomen:  Normal bowel sounds.  No bruits.  Soft, and very obese without masses, hepatosplenomegaly or hernias noted.  No Tenderness.  No guarding or rebound tenderness.    Neurologic:  Alert and oriented x3;  grossly normal neurologically. Psych:  Alert and cooperative. Normal mood and affect.  Imaging Studies: No results found.  Labs: CBC    Component Value Date/Time   WBC 9.2 02/15/2022 1648   RBC 4.21 (L) 02/15/2022 1648   HGB 13.0 02/15/2022 1648   HCT 39.7 02/15/2022 1648   PLT 243 02/15/2022 1648   MCV 94.3 02/15/2022 1648   MCH 30.9 02/15/2022 1648   MCHC 32.7 02/15/2022 1648   RDW 12.4 02/15/2022 1648   LYMPHSABS 2.0 11/26/2016 1912   MONOABS 0.9 11/26/2016 1912   EOSABS 0.3 11/26/2016 1912   BASOSABS 0.0 11/26/2016 1912    CMP     Component Value Date/Time   NA 136 02/15/2022 1648   K 4.3 02/15/2022 1648   CL 103 02/15/2022 1648   CO2 25 02/15/2022 1648   GLUCOSE 103 (H) 02/15/2022 1648   BUN 34 (H) 02/15/2022 1648   CREATININE 0.95 02/15/2022 1648   CALCIUM  9.6 02/15/2022 1648   PROT 7.0 11/26/2016 1912   ALBUMIN 3.8 11/26/2016 1912   AST 26 11/26/2016 1912   ALT 23 11/26/2016 1912   ALKPHOS 51 11/26/2016 1912    BILITOT 0.2 (L) 11/26/2016 1912   GFRNONAA >60 02/15/2022 1648   GFRAA >60 11/26/2016 1912    Assessment and Plan:   Willie Elliott is a 63 y.o. y/o male has been referred for   1.  Colon cancer screening: Is up-to-date.  Last colonoscopy 01/2016 showed 3 small benign hyperplastic polyps removed.  He has no history of adenomatous colon polyps.  No family history of colon cancer.  He is normal risk for colon  cancer. - Colon cancer screening guidelines were discussed. - He will be due for a 10-year repeat screening colonoscopy 01/2026. - We are happy to schedule diagnostic colonoscopy sooner if he develops any GI symptoms such as rectal bleeding, iron deficiency anemia, or unintentional weight loss.  2.  Family history of stomach cancer (brother age 55) - I recommended for patient to schedule an upper endoscopy today, however he adamantly refused. - He states he is not having any upper GI symptoms.  Follow up February 2027 for a 10-year repeat screening colonoscopy.  Follow-up sooner if he develops any GI symptoms.  Willie Canard, PA-C

## 2024-06-08 ENCOUNTER — Encounter: Payer: Self-pay | Admitting: Physician Assistant

## 2024-06-08 ENCOUNTER — Ambulatory Visit: Admitting: Physician Assistant

## 2024-06-08 VITALS — BP 140/78 | HR 68 | Ht 68.0 in | Wt 282.0 lb

## 2024-06-08 DIAGNOSIS — Z01818 Encounter for other preprocedural examination: Secondary | ICD-10-CM | POA: Diagnosis not present

## 2024-06-08 DIAGNOSIS — Z800 Family history of malignant neoplasm of digestive organs: Secondary | ICD-10-CM | POA: Diagnosis not present

## 2024-06-08 DIAGNOSIS — Z1211 Encounter for screening for malignant neoplasm of colon: Secondary | ICD-10-CM

## 2024-06-08 NOTE — Patient Instructions (Addendum)
 Your last Colonoscopy 01/2016 showed no precancerous polyps.  10 year repeat Screening Colonoscopy will be due 01/2026.  Please let us  know if you develop any GI symptoms such as rectal bleeding, nausea, vomiting, or abdominal pain.  _______________________________________________________  If your blood pressure at your visit was 140/90 or greater, please contact your primary care physician to follow up on this.  _______________________________________________________  If you are age 30 or older, your body mass index should be between 23-30. Your Body mass index is 42.88 kg/m. If this is out of the aforementioned range listed, please consider follow up with your Primary Care Provider.  If you are age 53 or younger, your body mass index should be between 19-25. Your Body mass index is 42.88 kg/m. If this is out of the aformentioned range listed, please consider follow up with your Primary Care Provider.   ________________________________________________________  The [facility] providers would like to encourage you to use MYCHART to communicate with providers for non-urgent requests or questions.  Due to long hold times on the telephone, sending your provider a message by [facility] may be a faster and more efficient way to get a response.  Please allow 48 business hours for a response.  Please remember that this is for non-urgent requests.  _______________________________________________________

## 2024-06-26 NOTE — Progress Notes (Signed)
 Addendum: Reviewed and agree with assessment and management plan. Asha Grumbine, Carie Caddy, MD

## 2024-10-09 ENCOUNTER — Encounter: Payer: Self-pay | Admitting: Skilled Nursing Facility1

## 2024-10-09 ENCOUNTER — Encounter: Attending: Internal Medicine | Admitting: Skilled Nursing Facility1

## 2024-10-09 DIAGNOSIS — E119 Type 2 diabetes mellitus without complications: Secondary | ICD-10-CM | POA: Diagnosis present

## 2024-10-09 NOTE — Progress Notes (Unsigned)
 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 Pt states he cannot read or write (turns out the pt is not actually illiterate). Pt states he wakes about 6 times in the night to urinate. Pt states he does not check his blood sugar. Pt states he does not have good energy.  Pt states he prefers to be on the job because home is stressful. Pt states he loves his grandchildren an loves spending time with them.   24 hr recall: for today specifically  3 shots Vodka in sunny D-light (only drinks the liquor offered from clients)   Beverages: gallon water  24 hr recall: typically wakes around 6:30-7am First meal 7:30-8: banana  Maybe once a week a second meal: sugar and egg or steak and egg Third meal: 4-5pm: 3 eggs Snack: butter + oatmeal   Beverages: gallon water  Diabetes Self-Management Education  Visit Type: First/Initial  Appt. Start Time: 3:15 Appt. End Time: 4:15  10/10/2024  Mr. Willie Elliott, identified by name and date of birth, is a 63 y.o. male with a diagnosis of Diabetes: Type 2.   ASSESSMENT  There were no vitals taken for this visit. There is no height or weight on file to calculate BMI.   Diabetes Self-Management Education - 10/10/24 1606       Visit Information   Visit Type First/Initial      Initial Visit   Diabetes Type Type 2    Are you currently following a meal plan? No    Are you taking your medications as prescribed? Yes      Health Coping   How would you rate your overall health? Fair      Psychosocial Assessment   Patient Belief/Attitude about Diabetes Defeat/Burnout    What is the hardest part about your diabetes right now, causing you the most concern, or is the most worrisome to you about your diabetes?   Taking/obtaining medications;Making healty food and beverage choices    Self-care barriers Debilitated state due to current medical condition    Self-management support  Family    Patient Concerns Healthy Lifestyle;Weight Control    Special Needs Simplified materials;Verbal instruction    Preferred Learning Style Visual;Hands on    Learning Readiness Contemplating    How often do you need to have someone help you when you read instructions, pamphlets, or other written materials from your doctor or pharmacy? 4 - Often      Pre-Education Assessment   Patient understands the diabetes disease and treatment process. Needs Instruction    Patient understands incorporating nutritional management into lifestyle. Needs Instruction    Patient undertands incorporating physical activity into lifestyle. Needs Instruction    Patient understands using medications safely. Needs Instruction    Patient understands monitoring blood glucose, interpreting and using results Needs Instruction    Patient understands prevention, detection, and treatment of acute complications. Needs Instruction    Patient understands prevention, detection, and treatment of chronic complications. Needs Instruction    Patient understands how to develop strategies to address psychosocial issues. Needs Instruction    Patient understands how to develop strategies to promote health/change behavior. Needs Instruction      Complications   Last HgB A1C per patient/outside source 6.9 %    How often do you check your blood sugar? 0 times/day (not testing)      Patient Education   Previous Diabetes Education No  Disease Pathophysiology Definition of diabetes, type 1 and 2, and the diagnosis of diabetes;Factors that contribute to the development of diabetes    Healthy Eating Role of diet in the treatment of diabetes and the relationship between the three main macronutrients and blood glucose level;Food label reading, portion sizes and measuring food.;Plate Method;Meal timing in regards to the patients' current diabetes medication.;Effects of alcohol on blood glucose and safety factors with consumption of  alcohol.;Information on hints to eating out and maintain blood glucose control.    Being Active Role of exercise on diabetes management, blood pressure control and cardiac health.;Identified with patient nutritional and/or medication changes necessary with exercise.    Medications Reviewed patients medication for diabetes, action, purpose, timing of dose and side effects.    Monitoring Taught/evaluated SMBG meter.;Daily foot exams    Acute complications Taught prevention, symptoms, and  treatment of hypoglycemia - the 15 rule.    Chronic complications Dental care;Retinopathy and reason for yearly dilated eye exams;Nephropathy, what it is, prevention of, the use of ACE, ARB's and early detection of through urine microalbumia.;Lipid levels, blood glucose control and heart disease;Assessed and discussed foot care and prevention of foot problems    Diabetes Stress and Support Identified and addressed patients feelings and concerns about diabetes;Worked with patient to identify barriers to care and solutions;Role of stress on diabetes      Individualized Goals (developed by patient)   Nutrition Follow meal plan discussed;General guidelines for healthy choices and portions discussed    Problem Solving Eating Pattern      Post-Education Assessment   Patient understands the diabetes disease and treatment process. Demonstrates understanding / competency    Patient understands incorporating nutritional management into lifestyle. Demonstrates understanding / competency    Patient undertands incorporating physical activity into lifestyle. Demonstrates understanding / competency    Patient understands using medications safely. Demonstrates understanding / competency    Patient understands monitoring blood glucose, interpreting and using results Demonstrates understanding / competency    Patient understands prevention, detection, and treatment of acute complications. Demonstrates understanding / competency     Patient understands prevention, detection, and treatment of chronic complications. Demonstrates understanding / competency    Patient understands how to develop strategies to address psychosocial issues. Demonstrates understanding / competency    Patient understands how to develop strategies to promote health/change behavior. Demonstrates understanding / competency      Outcomes   Expected Outcomes Demonstrated interest in learning but significant barriers to change    Future DMSE 2 months    Program Status Completed          Individualized Plan for Diabetes Self-Management Training:   Learning Objective:  Patient will have a greater understanding of diabetes self-management. Patient education plan is to attend individual and/or group sessions per assessed needs and concerns.    Expected Outcomes:  Demonstrated interest in learning but significant barriers to change  Education material provided: ADA - How to Thrive: A Guide for Your Journey with Diabetes, Food label handouts, A1C conversion sheet, Meal plan card, My Plate, and Snack sheet  If problems or questions, patient to contact team via:  Phone and Email  Future DSME appointment: 2 months

## 2024-12-17 ENCOUNTER — Encounter: Admitting: Skilled Nursing Facility1
